# Patient Record
Sex: Female | Born: 1985 | Hispanic: Yes | Marital: Single | Smoking: Former smoker
Health system: Southern US, Community
[De-identification: ages and names within clinical notes are randomized; demographics above are authoritative.]

## PROBLEM LIST (undated history)

## (undated) DIAGNOSIS — O24419 Gestational diabetes mellitus in pregnancy, unspecified control: Secondary | ICD-10-CM

## (undated) HISTORY — PX: HERNIA REPAIR: SHX51

## (undated) HISTORY — DX: Gestational diabetes mellitus in pregnancy, unspecified control: O24.419

---

## 2011-03-04 ENCOUNTER — Emergency Department (HOSPITAL_BASED_OUTPATIENT_CLINIC_OR_DEPARTMENT_OTHER)
Admission: EM | Admit: 2011-03-04 | Discharge: 2011-03-04 | Disposition: A | Discharge status: Home / Self Care | Payer: Self-pay | Attending: Emergency Medicine | Admitting: Emergency Medicine

## 2011-03-04 DIAGNOSIS — L02219 Cutaneous abscess of trunk, unspecified: Secondary | ICD-10-CM | POA: Insufficient documentation

## 2011-03-04 DIAGNOSIS — L02211 Cutaneous abscess of abdominal wall: Secondary | ICD-10-CM

## 2011-03-04 MED ORDER — SULFAMETHOXAZOLE-TRIMETHOPRIM 800-160 MG PO TABS: 1.0000 | ORAL_TABLET | Freq: Two times a day (BID) | ORAL | Status: AC

## 2011-03-04 MED ORDER — HYDROCODONE-ACETAMINOPHEN 5-500 MG PO TABS: 1.0000 | ORAL_TABLET | Freq: Four times a day (QID) | ORAL | Status: AC | PRN

## 2011-03-04 MED ORDER — LIDOCAINE HCL (PF) 1 % IJ SOLN
INTRAMUSCULAR | Status: AC
  Administered 2011-03-04: 5 mL
  Filled 2011-03-04: qty 5

## 2011-03-04 MED ORDER — CEPHALEXIN 500 MG PO CAPS: 500.0000 mg | ORAL_CAPSULE | Freq: Three times a day (TID) | ORAL | Status: AC

## 2011-03-04 NOTE — ED Provider Notes (Signed)
History     CSN: [id number] Arrival date & time: 03/04/2011 11:25 AM   First MD Initiated Contact with Patient 03/04/11 1128      Chief Complaint  Patient presents with  . Abscess    (Consider location/radiation/quality/duration/timing/severity/associated sxs/prior treatment) HPI Comments: Abscess on abdominal wall for one week, getting worse.  Patient is a 25 y.o. female presenting with abscess. The history is provided by the patient.  Abscess  This is a new problem. The current episode started less than one week ago. The onset was gradual. The problem has been gradually worsening. The abscess is present on the abdomen. The problem is moderate. The abscess is characterized by redness and swelling. It is unknown what she was exposed to. The abscess first occurred at home. Pertinent negatives include no fever.    History reviewed. No pertinent past medical history.  History reviewed. No pertinent past surgical history.  No family history on file.  History  Substance Use Topics  . Smoking status: Current Everyday Smoker  . Smokeless tobacco: Not on file  . Alcohol Use: No    OB History    Grav Para Term Preterm Abortions TAB SAB Ect Mult Living                  Review of Systems  Constitutional: Negative for fever and chills.  HENT: Negative for neck pain and neck stiffness.   Gastrointestinal: Negative for abdominal pain and abdominal distention.  Skin: Positive for rash.    Allergies  Review of patient's allergies indicates no known allergies.  Home Medications  No current outpatient prescriptions on file.  BP 122/78  Pulse 89  Temp(Src) 98.3 F (36.8 C) (Oral)  Resp 16  SpO2 98%  LMP 03/01/2011  Physical Exam  Constitutional: She is oriented to person, place, and time. She appears well-developed and well-nourished. No distress.  HENT:  Head: Normocephalic and atraumatic.  Neck: Normal range of motion. Neck supple.  Neurological: She is alert and  oriented to person, place, and time.  Skin:       There is a 3 cm fluctuant area on the abdominal wall in the suprapubic area with surrounding erythema, warmth.      ED Course  Procedures (including critical care time)  Labs Reviewed - No data to display No results found.   No diagnosis found.  INCISION AND DRAINAGE Performed by: Geoffery Lyons Consent: Verbal consent obtained. Risks and benefits: risks, benefits and alternatives were discussed Type: abscess  Body area: abd wall  Anesthesia: local infiltration  Local anesthetic: lidocaine 1% without epinephrine  Anesthetic total: 2 ml  Complexity: complex Blunt dissection to break up loculations  Drainage: purulent  Drainage amount: moderate  Packing material: 1/4 in iodoform gauze  Patient tolerance: Patient tolerated the procedure well with no immediate complications.    MDM  Will treat with keflex, bactrim, lortab.  Recheck in 2 days.        Geoffery Lyons, MD 03/04/11 1149

## 2011-03-04 NOTE — ED Notes (Signed)
abd abscess x 5 days

## 2011-03-08 ENCOUNTER — Encounter (HOSPITAL_BASED_OUTPATIENT_CLINIC_OR_DEPARTMENT_OTHER): Payer: Self-pay | Admitting: *Deleted

## 2011-03-08 ENCOUNTER — Emergency Department (HOSPITAL_BASED_OUTPATIENT_CLINIC_OR_DEPARTMENT_OTHER)
Admission: EM | Admit: 2011-03-08 | Discharge: 2011-03-08 | Disposition: A | Discharge status: Home / Self Care | Payer: Self-pay | Attending: Emergency Medicine | Admitting: Emergency Medicine

## 2011-03-08 DIAGNOSIS — L02211 Cutaneous abscess of abdominal wall: Secondary | ICD-10-CM

## 2011-03-08 DIAGNOSIS — L02219 Cutaneous abscess of trunk, unspecified: Secondary | ICD-10-CM | POA: Insufficient documentation

## 2011-03-08 DIAGNOSIS — F17200 Nicotine dependence, unspecified, uncomplicated: Secondary | ICD-10-CM | POA: Insufficient documentation

## 2011-03-08 NOTE — ED Notes (Signed)
Sterile drsg with bacitracin applied to site

## 2011-03-08 NOTE — ED Provider Notes (Signed)
History     CSN: [id number] Arrival date & time: 03/08/2011  6:30 AM   First MD Initiated Contact with Patient 03/08/11 [phone]      Chief Complaint  Patient presents with  . Abscess    (Consider location/radiation/quality/duration/timing/severity/associated sxs/prior treatment) HPI Comments: Patient here for follow up of abscess I+D.  This was performed by myself 4 days ago.  Patient reports feeling fine.  No problems or issues.    Patient is a 25 y.o. female presenting with abscess. The history is provided by the patient.  Abscess  Pertinent negatives include no fever.    History reviewed. No pertinent past medical history.  History reviewed. No pertinent past surgical history.  No family history on file.  History  Substance Use Topics  . Smoking status: Current Everyday Smoker  . Smokeless tobacco: Not on file  . Alcohol Use: No    OB History    Grav Para Term Preterm Abortions TAB SAB Ect Mult Living                  Review of Systems  Constitutional: Negative for fever and chills.  All other systems reviewed and are negative.    Allergies  Review of patient's allergies indicates no known allergies.  Home Medications   Current Outpatient Rx  Name Route Sig Dispense Refill  . CEPHALEXIN 500 MG PO CAPS Oral Take 1 capsule (500 mg total) by mouth 3 (three) times daily. 21 capsule 0  . HYDROCODONE-ACETAMINOPHEN 5-500 MG PO TABS Oral Take 1-2 tablets by mouth every 6 (six) hours as needed for pain. 15 tablet 0  . SULFAMETHOXAZOLE-TRIMETHOPRIM 800-160 MG PO TABS Oral Take 1 tablet by mouth 2 (two) times daily. 15 tablet 0    BP 115/74  Pulse 81  Resp 20  SpO2 100%  LMP 03/01/2011  Physical Exam  Constitutional: She is oriented to person, place, and time. She appears well-developed and well-nourished.  HENT:  Head: Normocephalic and atraumatic.  Neck: Normal range of motion. Neck supple.  Neurological: She is alert and oriented to person, place, and  time.  Skin: Skin is warm and dry.       The area of abscess appears to be healing well.  There is minimal residual redness and minimal drainage.      ED Course  Procedures (including critical care time)  Labs Reviewed - No data to display No results found.   No diagnosis found.    MDM  The packing was removed and a dressing applied.  Looks great and no further follow up needed.  Patient advised to complete antibiotics, return as needed.        Geoffery Lyons, MD 03/08/11 [phone]

## 2011-03-08 NOTE — ED Notes (Signed)
Patient had an abscess on her abd packed a few days ago. Here to have the packing removed.

## 2011-12-16 ENCOUNTER — Emergency Department (HOSPITAL_COMMUNITY)
Admission: EM | Admit: 2011-12-16 | Discharge: 2011-12-16 | Disposition: A | Discharge status: Home / Self Care | Payer: Self-pay | Attending: Emergency Medicine | Admitting: Emergency Medicine

## 2011-12-16 ENCOUNTER — Encounter (HOSPITAL_COMMUNITY): Payer: Self-pay | Admitting: Emergency Medicine

## 2011-12-16 DIAGNOSIS — T148 Other injury of unspecified body region: Secondary | ICD-10-CM | POA: Insufficient documentation

## 2011-12-16 DIAGNOSIS — W57XXXA Bitten or stung by nonvenomous insect and other nonvenomous arthropods, initial encounter: Secondary | ICD-10-CM | POA: Insufficient documentation

## 2011-12-16 NOTE — ED Notes (Signed)
Pt has reddened area on right forearm that she says she has been picking and squeezing. Thinks it is a spider bite. Did not see it bite her.

## 2011-12-16 NOTE — ED Notes (Signed)
Called pt x1 to bring back to room, no answer

## 2011-12-17 ENCOUNTER — Emergency Department (INDEPENDENT_AMBULATORY_CARE_PROVIDER_SITE_OTHER)
Admission: EM | Admit: 2011-12-17 | Discharge: 2011-12-17 | Disposition: A | Discharge status: Home / Self Care | Payer: Self-pay | Source: Home / Self Care

## 2011-12-17 ENCOUNTER — Encounter (HOSPITAL_COMMUNITY): Payer: Self-pay | Admitting: Emergency Medicine

## 2011-12-17 ENCOUNTER — Telehealth (HOSPITAL_COMMUNITY): Payer: Self-pay | Admitting: *Deleted

## 2011-12-17 DIAGNOSIS — IMO0002 Reserved for concepts with insufficient information to code with codable children: Secondary | ICD-10-CM

## 2011-12-17 DIAGNOSIS — N72 Inflammatory disease of cervix uteri: Secondary | ICD-10-CM

## 2011-12-17 DIAGNOSIS — L03119 Cellulitis of unspecified part of limb: Secondary | ICD-10-CM

## 2011-12-17 LAB — POCT PREGNANCY, URINE: Preg Test, Ur: NEGATIVE

## 2011-12-17 LAB — WET PREP, GENITAL
Trich, Wet Prep: NONE SEEN
Yeast Wet Prep HPF POC: NONE SEEN

## 2011-12-17 MED ORDER — AZITHROMYCIN 1 G PO PACK: 1.0000 g | PACK | Freq: Once | ORAL | Status: DC

## 2011-12-17 MED ORDER — AZITHROMYCIN 250 MG PO TABS: 1000.0000 mg | ORAL_TABLET | Freq: Once | ORAL | Status: DC

## 2011-12-17 MED ORDER — AZITHROMYCIN 250 MG PO TABS
ORAL_TABLET | ORAL | Status: AC
  Filled 2011-12-17: qty 4

## 2011-12-17 MED ORDER — CEFTRIAXONE SODIUM 1 G IJ SOLR
1.0000 g | Freq: Once | INTRAMUSCULAR | Status: AC
  Administered 2011-12-17: 1 g via INTRAMUSCULAR

## 2011-12-17 MED ORDER — AZITHROMYCIN 250 MG PO TABS
1000.0000 mg | ORAL_TABLET | Freq: Every day | ORAL | Status: DC
  Administered 2011-12-17: 1000 mg via ORAL

## 2011-12-17 MED ORDER — METRONIDAZOLE 500 MG PO TABS: 500.0000 mg | ORAL_TABLET | Freq: Two times a day (BID) | ORAL | Status: AC

## 2011-12-17 MED ORDER — DOXYCYCLINE HYCLATE 100 MG PO CAPS: 100.0000 mg | ORAL_CAPSULE | Freq: Two times a day (BID) | ORAL | Status: AC

## 2011-12-17 MED ORDER — LIDOCAINE HCL (PF) 1 % IJ SOLN
INTRAMUSCULAR | Status: AC
  Filled 2011-12-17: qty 5

## 2011-12-17 MED ORDER — CEFTRIAXONE SODIUM 1 G IJ SOLR
INTRAMUSCULAR | Status: AC
  Filled 2011-12-17: qty 10

## 2011-12-17 NOTE — ED Notes (Signed)
Doxycycline rx written by Hayden Rasmussen FNP, called pt to have her pick up.  No answer, left message for pt to return call

## 2011-12-17 NOTE — ED Notes (Signed)
Pt. here to pick up Rx. Doxycycline. She confirmed that she does have the other Rx. of Metronidazole. Kristen [name] 12/17/2011

## 2011-12-17 NOTE — ED Provider Notes (Signed)
History     CSN: [id number]  Arrival date & time 12/17/11  0840   First MD Initiated Contact with Patient 12/17/11 [phone]      Chief Complaint  Patient presents with  . Insect Bite    right fore arm  . Vaginal Discharge    vaginal odor    (Consider location/radiation/quality/duration/timing/severity/associated sxs/prior treatment) HPI Comments: 1: St 5 d ago noticed a small red lesion to the R forearm that progressed in size and underlying hardness. Later developed drainage of amber fluid. In past 2 days has has decreased in size and redness has turned lighter. No bug of any sort was actually seen. Now there is a 3cm slightly mounded pink lesion with narrow area of induration and light, scant amber fluid. Per pt this is much smaller and less tender.      2: Visited her PCP 2 months ago for a UTI and tx with Bactrim. She could not take full course due to GI irritation. She has no urinary sx's now. But c/o vaginal discharge with odor. Denies pelvic or abdominal pain. No missed menses.   History reviewed. No pertinent past medical history.  History reviewed. No pertinent past surgical history.  Family History  Problem Relation Age of Onset  . Diabetes Mother   . Diabetes Father     History  Substance Use Topics  . Smoking status: Current Everyday Smoker  . Smokeless tobacco: Not on file  . Alcohol Use: No    OB History    Grav Para Term Preterm Abortions TAB SAB Ect Mult Living   1 1 1              Review of Systems  Constitutional: Negative.   HENT: Negative.   Gastrointestinal: Negative.   Genitourinary: Positive for vaginal discharge. Negative for dysuria, urgency, decreased urine volume, vaginal bleeding, vaginal pain and pelvic pain.  Musculoskeletal: Negative.   Skin: Negative for pallor and rash.       See HPI   Neurological: Negative.     Allergies  Bactrim  Home Medications   Current Outpatient Rx  Name Route Sig Dispense Refill  . DOXYCYCLINE  HYCLATE 100 MG PO CAPS Oral Take 1 capsule (100 mg total) by mouth 2 (two) times daily. 14 capsule 0  . METRONIDAZOLE 500 MG PO TABS Oral Take 1 tablet (500 mg total) by mouth 2 (two) times daily before a meal. 14 tablet 0    BP 109/60  Pulse 72  Temp 98.6 F (37 C) (Oral)  Resp 16  SpO2 100%  LMP 11/25/2011  Physical Exam  Constitutional: She is oriented to person, place, and time. She appears well-developed and well-nourished.  Neck: Normal range of motion. Neck supple.  Cardiovascular: Normal rate and regular rhythm.   Pulmonary/Chest: No respiratory distress. She has no wheezes.  Abdominal: Soft. She exhibits no distension and no mass. There is no tenderness. There is no rebound and no guarding.  Genitourinary:       BUS normal. Pelvis is adequate for speculum entry. No odor detected. Scant white vaginal discharge. Os parous. No bleeding.  Bimanual: +CMT. No adnexal tenderness.   Musculoskeletal: Normal range of motion.  Neurological: She is alert and oriented to person, place, and time.  Skin: Skin is warm and dry.  Psychiatric: She has a normal mood and affect.    ED Course  Procedures (including critical care time)  Labs Reviewed  WET PREP, GENITAL - Abnormal; Notable for the following:  Clue Cells Wet Prep HPF POC MANY (*)     WBC, Wet Prep HPF POC MODERATE (*)     All other components within normal limits  POCT PREGNANCY, URINE  URINE CULTURE  GC/CHLAMYDIA PROBE AMP, GENITAL  CULTURE, ROUTINE-ABSCESS   No results found.   1. Cellulitis And Abscess Of Forearm   2. Cervicitis       MDM  Rocephin 250mg  IM now.  Azithromycin 1gm  And Flagyl 500 bid for 7 d scripted.  RTO for problems or not improved  Will call results of wet prep and DNA Culture of R arm wound obtained.  Doxy 100 bid for 7 d       Hayden Rasmussen, NP 12/17/11 1506

## 2011-12-17 NOTE — ED Notes (Signed)
Pt c/o ? Spider bite to right forearm red/hard and warm to touch x 5 days. Pt states that she has drained it and cleaned it with rubbing alcohol. Pt also c/o vaginal d/c with odor x several days w/o irritation.  Pt was treated 2 months ago in [place] for a uti did not finish med Bactrim, pt states she experienced dizziness and nausea.

## 2011-12-17 NOTE — ED Provider Notes (Signed)
Medical screening examination/treatment/procedure(s) were performed by non-physician practitioner and as supervising physician I was immediately available for consultation/collaboration.  Leslee Home, M.D.   Reuben Likes, MD 12/17/11 2108

## 2011-12-17 NOTE — ED Notes (Signed)
Pt. called back. I told her Rulon Eisenmenger had called her to pick up a Rx. of Doxycycline for the wound infection from insect bite. I told her I would leave it at the front desk. She said she will pick it up. Kristen [name] 12/17/2011

## 2011-12-18 LAB — GC/CHLAMYDIA PROBE AMP, GENITAL
Chlamydia, DNA Probe: NEGATIVE
GC Probe Amp, Genital: NEGATIVE

## 2011-12-18 LAB — URINE CULTURE
Colony Count: NO GROWTH
Culture: NO GROWTH

## 2011-12-20 LAB — CULTURE, ROUTINE-ABSCESS: Gram Stain: NONE SEEN

## 2011-12-24 NOTE — ED Notes (Addendum)
GC/Chlamydia neg., Wet prep: many clue cells, mod. WBC's, Urine culture: no growth, Abscess culture R arm: Few Staph. Aureus. Pt. adequately treated with Flagyl and Doxycyline. [name] 12/24/2011

## 2014-02-12 ENCOUNTER — Encounter (HOSPITAL_COMMUNITY): Payer: Self-pay | Admitting: Emergency Medicine

## 2015-04-15 ENCOUNTER — Encounter (HOSPITAL_BASED_OUTPATIENT_CLINIC_OR_DEPARTMENT_OTHER): Payer: Self-pay | Admitting: *Deleted

## 2015-04-15 ENCOUNTER — Emergency Department (HOSPITAL_BASED_OUTPATIENT_CLINIC_OR_DEPARTMENT_OTHER): Payer: Self-pay

## 2015-04-15 ENCOUNTER — Emergency Department (HOSPITAL_BASED_OUTPATIENT_CLINIC_OR_DEPARTMENT_OTHER)
Admission: EM | Admit: 2015-04-15 | Discharge: 2015-04-15 | Disposition: A | Discharge status: Home / Self Care | Payer: Self-pay | Attending: Emergency Medicine | Admitting: Emergency Medicine

## 2015-04-15 DIAGNOSIS — J069 Acute upper respiratory infection, unspecified: Secondary | ICD-10-CM | POA: Insufficient documentation

## 2015-04-15 DIAGNOSIS — H109 Unspecified conjunctivitis: Secondary | ICD-10-CM | POA: Insufficient documentation

## 2015-04-15 DIAGNOSIS — F17210 Nicotine dependence, cigarettes, uncomplicated: Secondary | ICD-10-CM | POA: Insufficient documentation

## 2015-04-15 DIAGNOSIS — H938X9 Other specified disorders of ear, unspecified ear: Secondary | ICD-10-CM | POA: Insufficient documentation

## 2015-04-15 DIAGNOSIS — M791 Myalgia: Secondary | ICD-10-CM | POA: Insufficient documentation

## 2015-04-15 MED ORDER — ERYTHROMYCIN 5 MG/GM OP OINT
TOPICAL_OINTMENT | Freq: Once | OPHTHALMIC | Status: AC
  Administered 2015-04-15: 1 via OPHTHALMIC
  Filled 2015-04-15: qty 3.5

## 2015-04-15 MED ORDER — ERYTHROMYCIN 5 MG/GM OP OINT: TOPICAL_OINTMENT | OPHTHALMIC | Status: DC

## 2015-04-15 NOTE — Discharge Instructions (Signed)
Follow with the eye doctor in the next 24 to 28 hours  Take the erythromycin ointment that you were given today and apply a 1 cm ribbon to the lower eye 6 times a day for 7-10 days.   Wash your hands frequently and try to keep your hands away from the affected eye(s).   You should be feeling some improvement by 48 hours. If symptoms worsen, you develop pain, change in your vision or no improvement in 48 hours please follow with the ophthalmologist or, if that is not possible, return to the emergency room for a recheck.  Do not hesitate to return to the emergency room for any new, worsening or concerning symptoms.  Please obtain primary care using resource guide below. Let them know that you were seen in the emergency room and that they will need to obtain records for further outpatient management.    Emergency Department Resource Guide 1) Find a Doctor and Pay Out of Pocket Although you won't have to find out who is covered by your insurance plan, it is a good idea to ask around and get recommendations. You will then need to call the office and see if the doctor you have chosen will accept you as a new patient and what types of options they offer for patients who are self-pay. Some doctors offer discounts or will set up payment plans for their patients who do not have insurance, but you will need to ask so you aren't surprised when you get to your appointment.  2) Contact Your Local Health Department Not all health departments have doctors that can see patients for sick visits, but many do, so it is worth a call to see if yours does. If you don't know where your local health department is, you can check in your phone book. The CDC also has a tool to help you locate your state's health department, and many state websites also have listings of all of their local health departments.  3) Find a Walk-in Clinic If your illness is not likely to be very severe or complicated, you may want to try a  walk in clinic. These are popping up all over the country in pharmacies, drugstores, and shopping centers. They're usually staffed by nurse practitioners or physician assistants that have been trained to treat common illnesses and complaints. They're usually fairly quick and inexpensive. However, if you have serious medical issues or chronic medical problems, these are probably not your best option.  No Primary Care Doctor: - Call Health Connect at  [phone] - they can help you locate a primary care doctor that  accepts your insurance, provides certain services, etc. - Physician Referral Service- [phone]  Chronic Pain Problems: Organization         Address  Phone   Notes  [facility]  [phone] Patients need to be referred by their primary care doctor.   Medication Assistance: Organization         Address  Phone   Notes  [facility]ication [facility] [address] [place], [place] [id number] [phone] --Must be a resident of [facility] [place] -- Must have NO insurance coverage whatsoever (no Medicaid/ Medicare, etc.) -- The pt. MUST have a primary care doctor that directs their care regularly and follows them in the community   Med[facility]ist  [phone]   [employer]  [phone]    Agencies that provide inexpensive medical care: Organization  Address  Phone   Notes  [place]  [phone]   [facility]    [phone]   [facility] [place], [place] [address] [phone]   [place] [address] [place] [place] [phone]   Planned Parenthood    [phone]   [facility]    [phone]   [place] and [place] [address], [place] Phone:  [phone], Fax:  [phone] Hours of Operation:  9 am - 6 pm, M-F.  Also accepts Medicaid/Medicare and self-pay.  [facility] for [place], [address], [place] Phone: [phone], Fax: [phone]. Hours of Operation:  8:30 am - 5:30 pm, M-F.  Also accepts Medicaid and self-pay.  [facility] [place] [address] [place] Phone: [phone]   [place], [place], [place] [phone], Ext. [phone] Mondays & Thursdays: 7-9 AM.  First 15 patients are seen on a first come, first serve basis.    [place] Providers:  Organization         Address  Phone   Notes  [facility] [address], [place] [phone] Also accepts self-pay patients.  [facility] [address], [place]  [phone]   [place], [address], [place] [phone]   [facility] [address], [place] [phone]   Lucianne Lei [address], [place]   [phone] Only accepts [place] Access [place] patients after they have their name applied to their card.   Self-Pay (no insurance) in [facility]:  Organization         Address  Phone   Notes  Sickle Cell Patients, [facility] [place] [phone]   [facility] [place] [phone]   [facility] [place]  [place], [place], [place] [phone]   [facility]/Dr. Osei-Bonsu  [address] [place] or [address], [place] [phone] Phone number for both [place] and [place] locations is the same.  [facility] [address], [place] [phone]   [facility] [address], [place] or [address] [phone]   [facility] [address] [place] [phone], phone; [phone], fax Sees  patients 1st and 3rd Saturday of every month.  Must not qualify for public or private insurance (i.e. Medicaid, Medicare, [place] Health Choice, Veterans' Benefits)  Household income should be no more than 200% of the poverty level The clinic cannot treat you if you are pregnant or think you are pregnant  Sexually transmitted diseases are not treated at the clinic.    Dental Care: Organization         Address  Phone  Notes  [facility] Department of [facility] [place] [phone] Accepts children up to age 36 who are enrolled in [place] or [place]; pregnant women with a Medicaid card; and children who have applied for Medicaid or [place] Health Choice, but were declined, whose parents can pay a reduced fee at time  of service.  [facility] Department of [facility]  [address], [place] [phone] Accepts children up to age 59 who are enrolled in [place] or [place]; pregnant women with a Medicaid card; and children who have applied for Medicaid or [place] Health Choice, but were declined, whose parents can pay a reduced fee at time of service.  [facility] Adult Dental Access PROGRAM  [place] [phone] Patients are seen by appointment only. Walk-ins are not accepted. [place] will see patients 41 years of age and older. Monday - Tuesday (8am-5pm) Most Wednesdays (8:30-5pm) $30 per visit, cash only  [facility] Access [facility]  [address], [facility] [phone] Patients are seen by appointment only. Walk-ins are not accepted. [place] will see patients 59 years of age and older. One Wednesday Evening (Monthly: Volunteer Based).  $30 per visit, cash only  [place]  [phone] for adults; Children under age 48, call Graduate Pediatric Dentistry at [phone]. Children aged 62-14, please call [phone] to request a  pediatric application.  Dental services are provided in all areas of dental care including fillings, crowns and bridges, complete and partial dentures, implants, gum treatment, root canals, and extractions. Preventive care is also provided. Treatment is provided to both adults and children. Patients are selected via a lottery and there is often a waiting list.   [facility] [address] [place]  [phone] [email or web address]   [facility] [address] [place], [place] [phone], Ext. [phone] Second and Fourth Thursday of each month, opens at 6:30 AM; Clinic ends at 9 AM.  Patients are seen on a first-come first-served basis, and a limited number are seen during each clinic.   [facility]  [address] Hillard Danker [place], [place] [phone]   Eligibility Requirements You must have lived in [place], [place], or [place] counties for at least the last three months.   You cannot be eligible for state or federal sponsored [employer], including [employer], [place], or [employer].   You generally cannot be eligible for healthcare insurance through your employer.    How to apply: Eligibility screenings are held every Tuesday and Wednesday afternoon from 1:00 pm until 4:00 pm. You do not need an appointment for the interview!  [facility] [address], [place], [place]   [place]  [place] Department  [place]  [phone]    Behavioral Health Resources in the Community: Intensive Outpatient Programs Organization         Address  Phone  Notes  [place]. [address] [place], [place] [phone]   [facility] Outpatient [address], [place], [place]   ADS: Alcohol & Drug Svcs [address] [place], [place]   [place] [address]  [place], [place] or [phone]   Substance Abuse Resources Organization         Address  Phone  Notes  Alcohol and Drug Services  [phone]   [place]  [phone]   [facility] [place]   Chinita Pester  [phone]   Residential & Outpatient Substance Abuse Program  [phone]   Psychological Services Organization         Address  Phone  Notes  [facility]  [place]  [phone]   [place] [address] [place] or [phone]    Mobile Crisis Teams Organization         Address  Phone  Notes  [facility], Mobile Crisis Care Unit  [phone]   [facility] Psychotherapeutic Services  [address] [place], [place]   [place] Levels [address], [place] [phone]    Self-Help/Support Groups Organization         Address  Phone             Notes  [place]. of [place] - variety of support groups  [place] Call for more information  Narcotics Anonymous (NA), [facility] [address], [employer] [place]  2 meetings at this location   Special educational needs teacher         Address  Phone  Notes  [facility]idential [facility] [place],    [place]  [phone]   [facility]  [address], [place] [phone], [place], [place]   [place], [place] [phone] Admissions: 8am-3pm M-F  Incentives Substance [place] [address],    [place], [place] [phone]   [facility] [address] [place], [place], [place]   [facility] [address],  [place], [place]   [facility] Programs - Intensive Outpatient [address] Kristeen [address], [place], [place]   [facility] ([place] [address].,    [place], [place] [phone] or [phone]   [facility] (RTS) [address], [place], [place] Accepts Medicaid  [facility] [place] [address]  [place] [phone] Substance Abuse/Addiction Treatment   [facility] Organization         Address  Phone  Notes  [facility]  [phone]   Domenic Schwab, PhD [address] Arlis Porta [place], [place]   [phone] or [phone]   [place], [place] [phone]   [facility] [address] [place], [place] [phone] Insurance/Medicaid/sponsorship through [facility] and Families [address] [place]                                    [place], [place] [phone] [place] [address], [place] [phone]    Dr. Adele Schilder  [phone]   [facility] Dept. 1) [address] [place] 2) [place] 3)  [place] [address], [place] [phone]  [phone]   [place] [phone] or [phone] (After Hours)

## 2015-04-15 NOTE — ED Notes (Signed)
Patient transported to X-ray 

## 2015-04-15 NOTE — ED Provider Notes (Signed)
CSN: [id number]     Arrival date & time 04/15/15  1320 History   First MD Initiated Contact with Patient 04/15/15 1402     Chief Complaint  Patient presents with  . URI     (Consider location/radiation/quality/duration/timing/severity/associated sxs/prior Treatment) HPI   Blood pressure 133/81, pulse 104, temperature 97.9 F (36.6 C), temperature source Oral, resp. rate 16, height 5' (1.524 m), weight 49.896 kg, last menstrual period 03/30/2015, SpO2 100 %.  Kristen [name] is a 30 y.o. female complaining of productive cough, sore throat, ear pressure, diffuse headache, myalgia, irritated eyes that are crusted shut in the a.m., tactile fever and chills onset one day ago. Patient denies chest pain, shortness of breath, nausea, vomiting, cervicalgia, focal abdominal pain, change in bowel or bladder habits. On review of systems she notes a slightly decreased by mouth intake.  Denies contact lens use.   History reviewed. No pertinent past medical history. History reviewed. No pertinent past surgical history. Family History  Problem Relation Age of Onset  . Diabetes Mother   . Diabetes Father    Social History  Substance Use Topics  . Smoking status: Current Every Day Smoker -- 0.50 packs/day    Types: Cigarettes  . Smokeless tobacco: None  . Alcohol Use: No   OB History    Gravida Para Term Preterm AB TAB SAB Ectopic Multiple Living   1 1 1             Review of Systems  10 systems reviewed and found to be negative, except as noted in the HPI.   Allergies  Bactrim  Home Medications   Prior to Admission medications   Not on File   BP 133/81 mmHg  Pulse 104  Temp(Src) 97.9 F (36.6 C) (Oral)  Resp 16  Ht 5' (1.524 m)  Wt 49.896 kg  BMI 21.48 kg/m2  SpO2 100%  LMP 03/30/2015 Physical Exam  Constitutional: She appears well-developed and well-nourished.  HENT:  Head: Normocephalic.  Right Ear: External ear normal.  Left Ear: External ear normal.  Mouth/Throat:  Oropharynx is clear and moist. No oropharyngeal exudate.  No drooling or stridor. Posterior pharynx mildly erythematous no significant tonsillar hypertrophy. No exudate. Soft palate rises symmetrically. No TTP or induration under tongue.   No tenderness to palpation of frontal or bilateral maxillary sinuses.  Mild mucosal edema in the nares with scant rhinorrhea.  Bilateral tympanic membranes with normal architecture and good light reflex.    Eyes: EOM are normal. Pupils are equal, round, and reactive to light.  Trace conjunctival injection bilaterally  Neck: Normal range of motion. Neck supple.  Cardiovascular: Normal rate and regular rhythm.   Pulmonary/Chest: Effort normal and breath sounds normal. No stridor. No respiratory distress. She has no wheezes. She has no rales. She exhibits no tenderness.  Abdominal: Soft. There is no tenderness. There is no rebound and no guarding.  Nursing note and vitals reviewed.   ED Course  Procedures (including critical care time) Labs Review Labs Reviewed - No data to display  Imaging Review No results found. I have personally reviewed and evaluated these images and lab results as part of my medical decision-making.   EKG Interpretation None      MDM   Final diagnoses:  URI (upper respiratory infection)  Bilateral conjunctivitis    Filed Vitals:   04/15/15 1333  BP: 133/81  Pulse: 104  Temp: 97.9 F (36.6 C)  TempSrc: Oral  Resp: 16  Height: 5' (1.524 m)  Weight:  49.896 kg  SpO2: 100%    Medications  erythromycin ophthalmic ointment (not administered)    Kristen [name] is 30 y.o. female presenting with cough, sore throat, congestion, headache, subjective fever and chills. Lung sounds are clear to auscultation, vital signs with a very mild tachycardia. She is afebrile, chest x-rays without infiltrate. Patient reports crusting upon awakening, will give erythromycin for conjunctivitis. Counseled on infection control  techniques.  Evaluation does not show pathology that would require ongoing emergent intervention or inpatient treatment. Pt is hemodynamically stable and mentating appropriately. Discussed findings and plan with patient/guardian, who agrees with care plan. All questions answered. Return precautions discussed and outpatient follow up given.   New Prescriptions   ERYTHROMYCIN OPHTHALMIC OINTMENT    Place a 1/2 inch ribbon of ointment into the lower eyelid 6x/day for 7 to 10 days         Wynetta Emeryicole Audray Rumore, PA-C 04/15/15 1510  Rolan BuccoMelanie Belfi, MD 04/15/15 1523

## 2015-04-15 NOTE — ED Notes (Signed)
Patient stable and ambulatory.  Patient verbalizes understanding of discharge medications, instructions and follow-up. 

## 2015-04-15 NOTE — ED Notes (Signed)
Patient is driving home.  So, showed how to administer ointment and patient verbalizes and demonstrated understanding, but didn't administered into patient's eye at this time.  Patient will apply ointment at home in order to not impair vision.

## 2015-04-15 NOTE — ED Notes (Signed)
PA at bedside.

## 2015-04-15 NOTE — ED Notes (Signed)
Cough, sore throat, stuffy ears, headache, aching all over, eyes are itching and draining.

## 2016-02-09 DIAGNOSIS — N200 Calculus of kidney: Secondary | ICD-10-CM | POA: Insufficient documentation

## 2016-10-12 IMAGING — DX DG CHEST 2V
2 series · 2 of 2 positions shown · non-contrast
Comparison: None.

CLINICAL DATA: Cough, fever, and congestion for 1 week.

EXAM:
CHEST  2 VIEW

[chest pa]
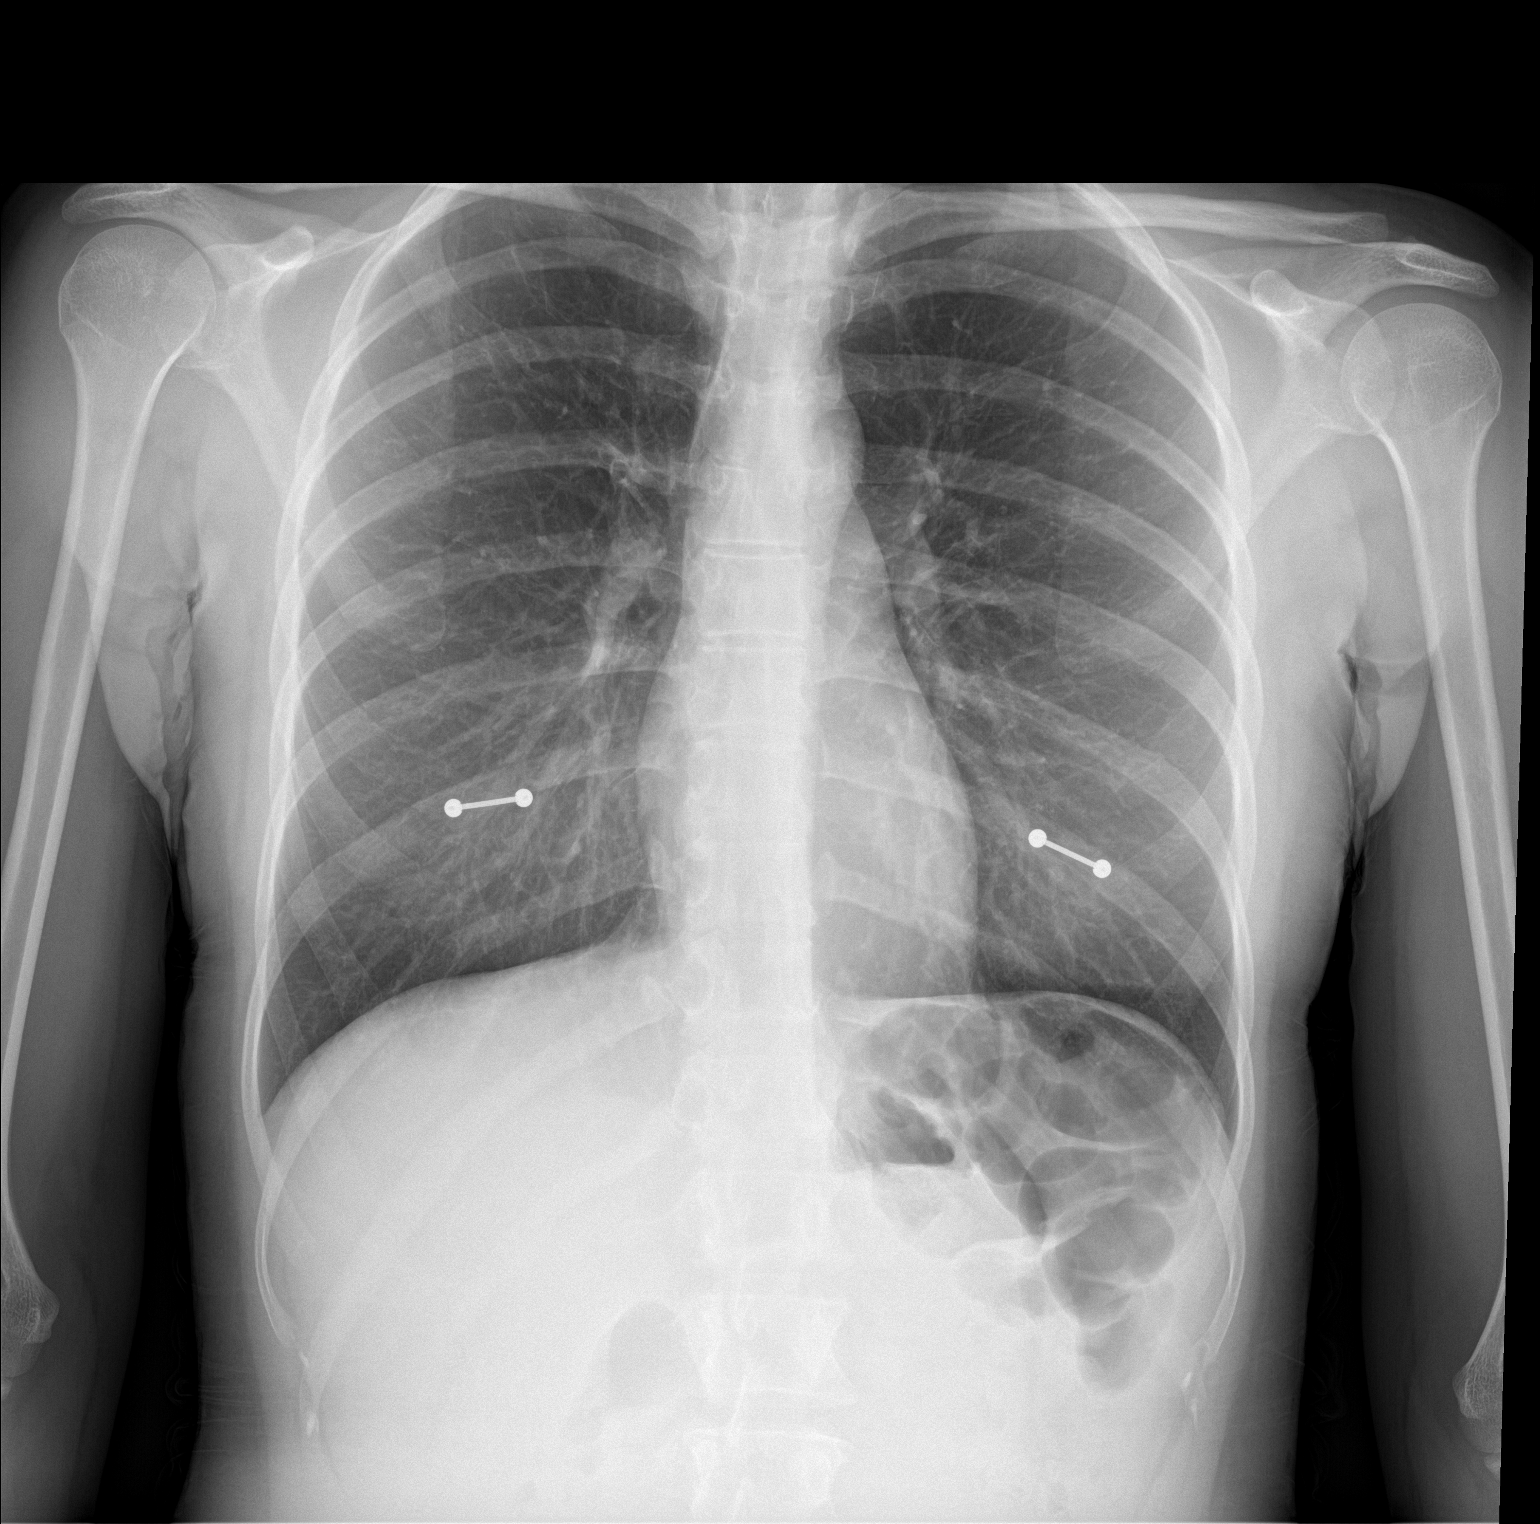

[chest lat]
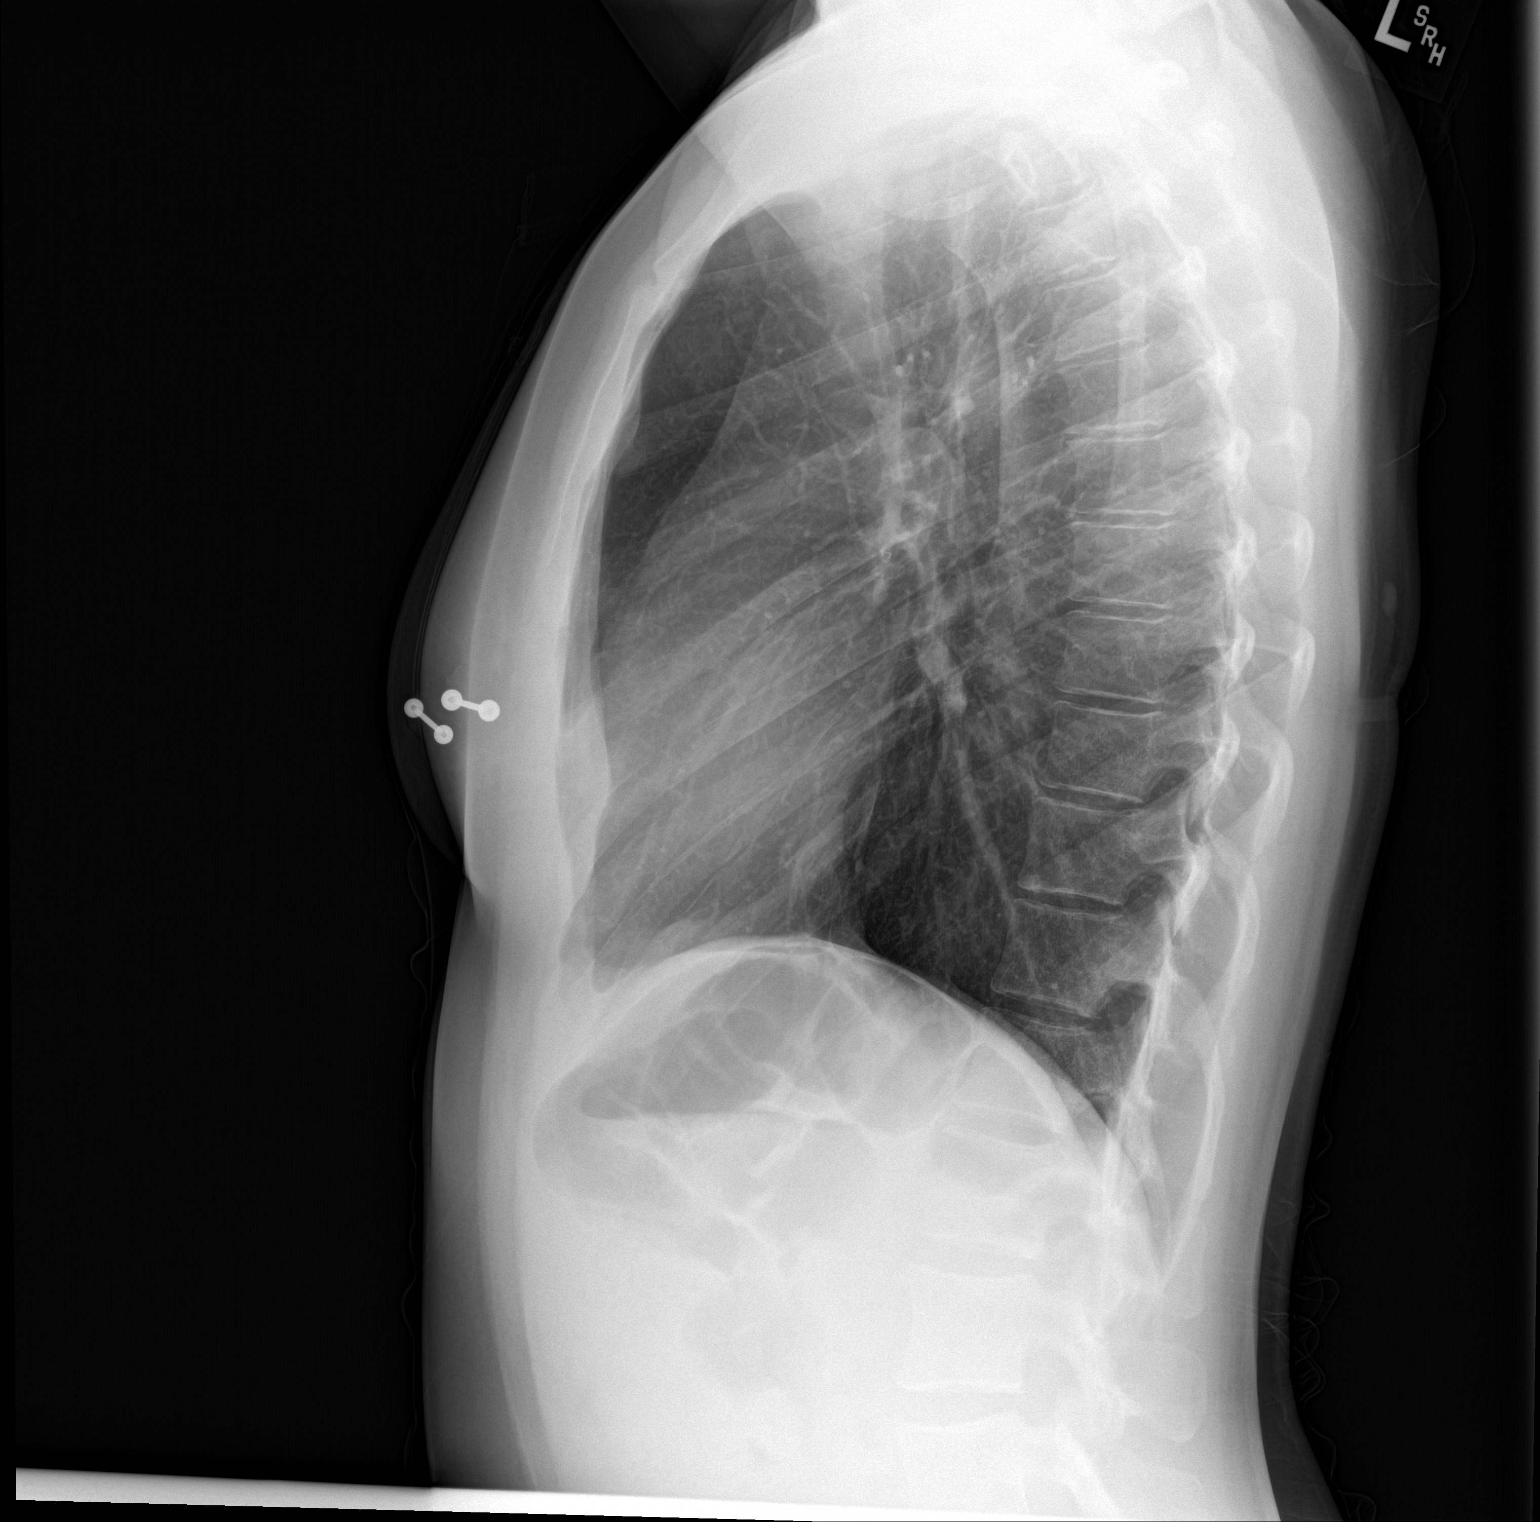

[2 of 2 positions shown; findings below may reference images not displayed]

FINDINGS: The heart size and mediastinal contours are within normal limits.
Both lungs are clear. There may be mild hyperinflation. The
visualized skeletal structures are unremarkable. Overlying nipple
rings.
IMPRESSION: No active cardiopulmonary disease.

## 2017-05-17 DIAGNOSIS — K429 Umbilical hernia without obstruction or gangrene: Secondary | ICD-10-CM | POA: Insufficient documentation

## 2017-05-17 HISTORY — DX: Umbilical hernia without obstruction or gangrene: K42.9

## 2017-05-19 HISTORY — PX: HERNIA REPAIR: SHX51

## 2019-05-02 DIAGNOSIS — O09299 Supervision of pregnancy with other poor reproductive or obstetric history, unspecified trimester: Secondary | ICD-10-CM | POA: Insufficient documentation

## 2021-12-04 ENCOUNTER — Encounter: Payer: Self-pay | Admitting: Family Medicine

## 2021-12-04 ENCOUNTER — Other Ambulatory Visit (HOSPITAL_COMMUNITY)
Admission: RE | Admit: 2021-12-04 | Discharge: 2021-12-04 | Disposition: A | Discharge status: Home / Self Care | Payer: Medicaid Other | Source: Ambulatory Visit | Attending: Family Medicine | Admitting: Family Medicine

## 2021-12-04 ENCOUNTER — Ambulatory Visit (INDEPENDENT_AMBULATORY_CARE_PROVIDER_SITE_OTHER): Payer: Medicaid Other | Admitting: Family Medicine

## 2021-12-04 VITALS — BP 142/80 | HR 70 | Ht 61.0 in | Wt 131.0 lb

## 2021-12-04 DIAGNOSIS — Z3481 Encounter for supervision of other normal pregnancy, first trimester: Secondary | ICD-10-CM

## 2021-12-04 DIAGNOSIS — O0990 Supervision of high risk pregnancy, unspecified, unspecified trimester: Secondary | ICD-10-CM | POA: Insufficient documentation

## 2021-12-04 DIAGNOSIS — O09511 Supervision of elderly primigravida, first trimester: Secondary | ICD-10-CM

## 2021-12-04 DIAGNOSIS — Z8632 Personal history of gestational diabetes: Secondary | ICD-10-CM

## 2021-12-04 DIAGNOSIS — O09529 Supervision of elderly multigravida, unspecified trimester: Secondary | ICD-10-CM | POA: Insufficient documentation

## 2021-12-04 DIAGNOSIS — Z3A10 10 weeks gestation of pregnancy: Secondary | ICD-10-CM

## 2021-12-04 NOTE — Progress Notes (Signed)
Patient desires waterbirth- information given with new ob packet. Armandina Stammer RN

## 2021-12-04 NOTE — Addendum Note (Signed)
Addended by: Levie Heritage on: 12/04/2021 12:08 PM   Modules accepted: Orders

## 2021-12-04 NOTE — Patient Instructions (Signed)

## 2021-12-04 NOTE — Progress Notes (Signed)
Subjective:  Kristen [name] is a [id number] [redacted]w[redacted]d being seen today for her first obstetrical visit.  Her obstetrical history is significant for advanced maternal age, h/o A1 GDM. Has 5 prior vaginal deliveries that have been uncomplicated. Patient does intend to breast feed. Pregnancy history fully reviewed.  Patient reports no complaints.  BP (!) 142/80   Pulse 70   Ht 5\' 1"  (1.549 m)   Wt 131 lb (59.4 kg)   LMP 09/24/2021   BMI 24.75 kg/m   HISTORY: OB History  Gravida Para Term Preterm AB Living  6 5 5     5   SAB IAB Ectopic Multiple Live Births          5    # Outcome Date GA Lbr Len/2nd Weight Sex Delivery Anes PTL Lv  6 Current           5 Term 10/29/19   8 lb 5 oz (3.771 kg) F Vag-Spont     4 Term 06/22/16   8 lb 5 oz (3.771 kg) F Vag-Spont   LIV  3 Term 06/16/13   7 lb 11 oz (3.487 kg) F Vag-Spont     2 Term 06/25/10   8 lb 9 oz (3.884 kg) F Vag-Spont     1 Term 05/09/04   7 lb 10 oz (3.459 kg) M Vag-Spont       No past medical history on file.  No past surgical history on file.  Family History  Problem Relation Age of Onset   Diabetes Mother    Diabetes Father      Exam  BP (!) 142/80   Pulse 70   Ht 5\' 1"  (1.549 m)   Wt 131 lb (59.4 kg)   LMP 09/24/2021   BMI 24.75 kg/m   Chaperone present during exam  CONSTITUTIONAL: Well-developed, well-nourished female in no acute distress.  HENT:  Normocephalic, atraumatic, External right and left ear normal. Oropharynx is clear and moist EYES: Conjunctivae and EOM are normal. Pupils are equal, round, and reactive to light. No scleral icterus.  NECK: Normal range of motion, supple, no masses.  Normal thyroid.  CARDIOVASCULAR: Normal heart rate noted, regular rhythm RESPIRATORY: Clear to auscultation bilaterally. Effort and breath sounds normal, no problems with respiration noted. BREASTS: Symmetric in size. No masses, skin changes, nipple drainage, or lymphadenopathy. ABDOMEN: Soft, normal bowel sounds, no  distention noted.  No tenderness, rebound or guarding.  PELVIC: Normal appearing external genitalia; normal appearing vaginal mucosa and cervix. No abnormal discharge noted. Normal uterine size, no other palpable masses, no uterine or adnexal tenderness. MUSCULOSKELETAL: Normal range of motion. No tenderness.  No cyanosis, clubbing, or edema.  2+ distal pulses. SKIN: Skin is warm and dry. No rash noted. Not diaphoretic. No erythema. No pallor. NEUROLOGIC: Alert and oriented to person, place, and time. Normal reflexes, muscle tone coordination. No cranial nerve deficit noted. PSYCHIATRIC: Normal mood and affect. Normal behavior. Normal judgment and thought content.    Assessment:    Pregnancy: 05/11/04 There are no problems to display for this patient.     Plan:   1. Supervision of high risk pregnancy, antepartum FHT and FH normal - CBC/D/Plt+RPR+Rh+ABO+RubIgG... - Cytology - PAP( [place]) - Hemoglobin A1c - Enroll Patient in PreNatal Babyscripts - MFM OB DETAIL +14 WK; Future  2. Antepartum multigravida of advanced maternal age Recommended ASA 81mg  at 12 weeks  - CBC/D/Plt+RPR+Rh+ABO+RubIgG... - Cytology - PAP( [place]) - Hemoglobin A1c - Enroll Patient in PreNatal Babyscripts -  [place] MFM OB DETAIL +14 WK; Future  3. [redacted] weeks gestation of pregnancy  4. History of gestational diabetes Always diet controlled. Normal BMI (24) Expect 20-25# weight gain. - Hemoglobin A1c    Initial labs obtained Continue prenatal vitamins Reviewed n/v relief measures and warning s/s to report Reviewed recommended weight gain based on pre-gravid BMI Encouraged well-balanced diet Genetic & carrier screening discussed: requests [place] and [place] ,  Ultrasound discussed; fetal survey: requested CCNC completed> form faxed if has or is planning to apply for medicaid The nature of [place] [facility] [employer] with multiple MDs and other Advanced Practice Providers was  explained to patient; also emphasized that fellows, residents, and students are part of our team.   Problem list reviewed and updated. 75% of 30 min visit spent on counseling and coordination of care.     Levie Heritage 12/04/2021

## 2021-12-05 ENCOUNTER — Encounter: Payer: Self-pay | Admitting: Family Medicine

## 2021-12-05 LAB — CBC/D/PLT+RPR+RH+ABO+RUBIGG...
Antibody Screen: NEGATIVE
Basophils Absolute: 0 10*3/uL (ref 0.0–0.2)
Basos: 0 %
EOS (ABSOLUTE): 0.1 10*3/uL (ref 0.0–0.4)
Eos: 2 %
HCV Ab: NONREACTIVE
HIV Screen 4th Generation wRfx: NONREACTIVE
Hematocrit: 37.7 % (ref 34.0–46.6)
Hemoglobin: 12.7 g/dL (ref 11.1–15.9)
Hepatitis B Surface Ag: NEGATIVE
Immature Grans (Abs): 0 10*3/uL (ref 0.0–0.1)
Immature Granulocytes: 0 %
Lymphocytes Absolute: 1.7 10*3/uL (ref 0.7–3.1)
Lymphs: 24 %
MCH: 29.7 pg (ref 26.6–33.0)
MCHC: 33.7 g/dL (ref 31.5–35.7)
MCV: 88 fL (ref 79–97)
Monocytes Absolute: 0.4 10*3/uL (ref 0.1–0.9)
Monocytes: 6 %
Neutrophils Absolute: 5.1 10*3/uL (ref 1.4–7.0)
Neutrophils: 68 %
Platelets: 340 10*3/uL (ref 150–450)
RBC: 4.28 x10E6/uL (ref 3.77–5.28)
RDW: 13.2 % (ref 11.7–15.4)
RPR Ser Ql: NONREACTIVE
Rh Factor: POSITIVE
Rubella Antibodies, IGG: 2.63 index (ref >0.99)
WBC: 7.4 10*3/uL (ref 3.4–10.8)

## 2021-12-05 LAB — HCV INTERPRETATION

## 2021-12-05 LAB — HEMOGLOBIN A1C
Est. average glucose Bld gHb Est-mCnc: 120 mg/dL
Hgb A1c MFr Bld: 5.8 % — ABNORMAL HIGH (ref 4.8–5.6)

## 2021-12-09 LAB — CYTOLOGY - PAP
Chlamydia: NEGATIVE
Comment: NEGATIVE
Comment: NEGATIVE
Comment: NORMAL
Diagnosis: NEGATIVE
High risk HPV: NEGATIVE
Neisseria Gonorrhea: NEGATIVE

## 2021-12-10 LAB — PANORAMA PRENATAL TEST FULL PANEL:PANORAMA TEST PLUS 5 ADDITIONAL MICRODELETIONS: FETAL FRACTION: 5.7

## 2021-12-12 LAB — HORIZON 4 (SMA, CF, FRAGILE X, DMD)
CYSTIC FIBROSIS: NEGATIVE
DUCHENNE/BECKER MUSCULAR DYSTROPHY: NEGATIVE
FRAGILE X SYNDROME: NEGATIVE
REPORT SUMMARY: NEGATIVE
SPINAL MUSCULAR ATROPHY: NEGATIVE

## 2022-01-13 ENCOUNTER — Ambulatory Visit (INDEPENDENT_AMBULATORY_CARE_PROVIDER_SITE_OTHER): Payer: Medicaid Other | Admitting: Advanced Practice Midwife

## 2022-01-13 VITALS — BP 112/74 | HR 88 | Wt 136.0 lb

## 2022-01-13 DIAGNOSIS — Z8632 Personal history of gestational diabetes: Secondary | ICD-10-CM

## 2022-01-13 DIAGNOSIS — Q631 Lobulated, fused and horseshoe kidney: Secondary | ICD-10-CM | POA: Insufficient documentation

## 2022-01-13 DIAGNOSIS — O0992 Supervision of high risk pregnancy, unspecified, second trimester: Secondary | ICD-10-CM

## 2022-01-13 DIAGNOSIS — Z3A15 15 weeks gestation of pregnancy: Secondary | ICD-10-CM

## 2022-01-13 HISTORY — DX: Lobulated, fused and horseshoe kidney: Q63.1

## 2022-01-13 NOTE — Progress Notes (Signed)
   PRENATAL VISIT NOTE  Subjective:  Kristen [name] is a 36 y.o. G6P5005 at [redacted]w[redacted]d being seen today for ongoing prenatal care.  She is currently monitored for the following issues for this low-risk pregnancy and has Supervision of high risk pregnancy, antepartum; History of gestational diabetes in prior pregnancy, currently pregnant; Antepartum multigravida of advanced maternal age; Horseshoe kidney; Kidney stones; and Umbilical hernia without obstruction and without gangrene on their problem list.  Patient reports no complaints.  Contractions: Not present. Vag. Bleeding: None.   . Denies leaking of fluid.   The following portions of the patient's history were reviewed and updated as appropriate: allergies, current medications, past family history, past medical history, past social history, past surgical history and problem list.   Objective:   Vitals:   01/13/22 0851  BP: 112/74  Pulse: 88  Weight: 136 lb (61.7 kg)    Fetal Status: Fetal Heart Rate (bpm): 150         General:  Alert, oriented and cooperative. Patient is in no acute distress.  Skin: Skin is warm and dry. No rash noted.   Cardiovascular: Normal heart rate noted  Respiratory: Normal respiratory effort, no problems with respiration noted  Abdomen: Soft, gravid, appropriate for gestational age.  Pain/Pressure: Absent     Pelvic: Cervical exam deferred        Extremities: Normal range of motion.  Edema: None  Mental Status: Normal mood and affect. Normal behavior. Normal judgment and thought content.   Assessment and Plan:  Pregnancy: G3O7564 at [redacted]w[redacted]d 1. History of gestational diabetes    Hgb A1C was 5.8    Per Dr Nehemiah Settle will do an early 2 hour GTT    Discussed possibility of starting some random testing at home now.      Discussed T2DM vs gestational   2. [redacted] weeks gestation of pregnancy     Reviewed plans for [place] and further OB appts     May want WaterBirth.  Discussed class and possible exclusion criteria as preg  progresses.      Largest baby was 8+ 23 with no shoulder dystocia.Did have poly last pregnancy but no problems with delivery  Preterm labor symptoms and general obstetric precautions including but not limited to vaginal bleeding, contractions, leaking of fluid and fetal movement were reviewed in detail with the patient. Please refer to After Visit Summary for other counseling recommendations.     Future Appointments  Date Time Provider [place]  01/14/2022  8:50 AM [facility] NURSE [facility] None  02/16/2022  2:30 PM [facility] NURSE [facility]  02/16/2022  2:45 PM [facility] US4 [facility]  02/17/2022 10:55 AM Jimmye Norman, Wilkie Aye, CNM [facility] None    Hansel Feinstein, CNM

## 2022-01-13 NOTE — Patient Instructions (Signed)
Considering Waterbirth? Guide for patients at [facility] [employer] [facility]) Why consider waterbirth? Gentle birth for babies  Less pain medicine used in labor  May allow for passive descent/less pushing  May reduce perineal tears  More mobility and instinctive maternal position changes  Increased maternal relaxation   Is waterbirth safe? What are the risks of infection, drowning or other complications? Infection:  Very low risk (3.7 % for tub vs 4.8% for bed)  7 in 8000 waterbirths with documented infection  Poorly cleaned equipment most common cause  Slightly lower group B strep transmission rate  Drowning  Maternal:  Very low risk  Related to seizures or fainting  Newborn:  Very low risk. No evidence of increased risk of respiratory problems in multiple large studies  Physiological protection from breathing under water  Avoid underwater birth if there are any fetal complications  Once baby's head is out of the water, keep it out.  Birth complication  Some reports of cord trauma, but risk decreased by bringing baby to surface gradually  No evidence of increased risk of shoulder dystocia. Mothers can usually change positions faster in water than in a bed, possibly aiding the maneuvers to free the shoulder.   There are 2 things you MUST do to have a waterbirth with [facility]: Attend a waterbirth class at [place] at [facility]   3rd Wednesday of every month from 7-9 pm (virtual during [place]) [employer] at [email or web address] or [email or web address] or by calling [phone] Bring [place] the certificate from the class to your prenatal appointment or send via [place] with a midwife at 36 weeks* to see if you can still plan a waterbirth and to sign the consent.   *We also recommend that you schedule as many of your prenatal visits with a midwife as possible.    Helpful information: You may want to bring a bathing suit top to the hospital to  wear during labor but this is optional.  All other supplies are provided by the hospital. Please arrive at the hospital with signs of active labor, and do not wait at home until late in labor. It takes 45 min- 2 hours for COVID testing, fetal monitoring, and check in to your room to take place, plus transport and filling of the waterbirth tub.    Things that would prevent you from having a waterbirth: Unknown or Positive COVID-19 diagnosis upon admission to hospital* Premature, <37wks  Previous cesarean birth  Presence of thick meconium-stained fluid  Multiple gestation (Twins, triplets, etc.)  Uncontrolled diabetes or gestational diabetes requiring medication  Hypertension diagnosed in pregnancy or preexisting hypertension (gestational hypertension, preeclampsia, or chronic hypertension) Fetal growth restriction (your baby measures less than 10th percentile on ultrasound) Heavy vaginal bleeding  Non-reassuring fetal heart rate  Active infection (MRSA, etc.). Group B Strep is NOT a contraindication for waterbirth.  If your labor has to be induced and induction method requires continuous monitoring of the baby's heart rate  Other risks/issues identified by your obstetrical provider   Please remember that birth is unpredictable. Under certain unforeseeable circumstances your provider may advise against giving birth in the tub. These decisions will be made on a case-by-case basis and with the safety of you and your baby as our highest priority.   *Please remember that in order to have a waterbirth, you must test Negative to COVID-19 upon admission to the hospital.

## 2022-01-14 ENCOUNTER — Encounter: Payer: Self-pay | Admitting: General Practice

## 2022-01-14 ENCOUNTER — Ambulatory Visit: Payer: Medicaid Other

## 2022-01-14 DIAGNOSIS — O0990 Supervision of high risk pregnancy, unspecified, unspecified trimester: Secondary | ICD-10-CM

## 2022-01-14 DIAGNOSIS — Z3A16 16 weeks gestation of pregnancy: Secondary | ICD-10-CM

## 2022-01-14 DIAGNOSIS — O09299 Supervision of pregnancy with other poor reproductive or obstetric history, unspecified trimester: Secondary | ICD-10-CM

## 2022-01-14 NOTE — Progress Notes (Signed)
Patient sent to lab. Bexleigh Theriault HowardRN   

## 2022-01-16 ENCOUNTER — Encounter: Payer: Self-pay | Admitting: Advanced Practice Midwife

## 2022-01-16 DIAGNOSIS — E119 Type 2 diabetes mellitus without complications: Secondary | ICD-10-CM | POA: Insufficient documentation

## 2022-01-16 DIAGNOSIS — O24419 Gestational diabetes mellitus in pregnancy, unspecified control: Secondary | ICD-10-CM | POA: Insufficient documentation

## 2022-01-16 LAB — GLUCOSE TOLERANCE, 2 HOURS W/ 1HR
Glucose, 1 hour: 195 mg/dL — ABNORMAL HIGH (ref 70–179)
Glucose, 2 hour: 114 mg/dL (ref 70–152)
Glucose, Fasting: 85 mg/dL (ref 70–91)

## 2022-01-16 LAB — AFP, SERUM, OPEN SPINA BIFIDA
AFP MoM: 0.5
AFP Value: 18.2 ng/mL
Gest. Age on Collection Date: 16 weeks
Maternal Age At EDD: 35.8 yr
OSBR Risk 1 IN: 10000
Test Results:: NEGATIVE
Weight: 136 [lb_av]

## 2022-01-19 ENCOUNTER — Other Ambulatory Visit: Payer: Self-pay

## 2022-01-19 DIAGNOSIS — O24419 Gestational diabetes mellitus in pregnancy, unspecified control: Secondary | ICD-10-CM

## 2022-01-19 DIAGNOSIS — O99810 Abnormal glucose complicating pregnancy: Secondary | ICD-10-CM

## 2022-01-19 NOTE — Progress Notes (Signed)
Referral to diabetes management- failed gtt. Kathrene Alu RN

## 2022-01-20 ENCOUNTER — Encounter: Payer: Self-pay | Admitting: Advanced Practice Midwife

## 2022-02-12 ENCOUNTER — Other Ambulatory Visit: Payer: Medicaid Other

## 2022-02-16 ENCOUNTER — Ambulatory Visit: Payer: Medicaid Other | Attending: Family Medicine

## 2022-02-16 ENCOUNTER — Encounter: Payer: Self-pay | Admitting: Family Medicine

## 2022-02-16 ENCOUNTER — Ambulatory Visit: Payer: Medicaid Other | Admitting: *Deleted

## 2022-02-16 ENCOUNTER — Ambulatory Visit: Payer: Medicaid Other | Attending: Obstetrics and Gynecology | Admitting: Obstetrics and Gynecology

## 2022-02-16 ENCOUNTER — Other Ambulatory Visit: Payer: Self-pay | Admitting: *Deleted

## 2022-02-16 DIAGNOSIS — O24410 Gestational diabetes mellitus in pregnancy, diet controlled: Secondary | ICD-10-CM

## 2022-02-16 DIAGNOSIS — O09529 Supervision of elderly multigravida, unspecified trimester: Secondary | ICD-10-CM | POA: Diagnosis present

## 2022-02-16 DIAGNOSIS — O0990 Supervision of high risk pregnancy, unspecified, unspecified trimester: Secondary | ICD-10-CM | POA: Insufficient documentation

## 2022-02-16 DIAGNOSIS — O0942 Supervision of pregnancy with grand multiparity, second trimester: Secondary | ICD-10-CM | POA: Diagnosis not present

## 2022-02-16 DIAGNOSIS — Z3A20 20 weeks gestation of pregnancy: Secondary | ICD-10-CM | POA: Diagnosis not present

## 2022-02-16 DIAGNOSIS — O09522 Supervision of elderly multigravida, second trimester: Secondary | ICD-10-CM | POA: Diagnosis not present

## 2022-02-16 DIAGNOSIS — O24419 Gestational diabetes mellitus in pregnancy, unspecified control: Secondary | ICD-10-CM

## 2022-02-16 NOTE — Progress Notes (Signed)
Maternal-Fetal Medicine   Name: Kristen [name] DOB: 10/17/1985 MRN: [id number] Referring Provider: Hansel Feinstein, CNM  I had the pleasure of seeing [name] today at the [place] [facility].  She was accompanied by her husband.  She is G6 P5005 at 20w 5d gestation and is here for fetal anatomy scan.  She has a new diagnosis of gestational diabetes. She has not yet started checking her blood glucose. Recent hemoglobin A1c was 5.8%.   Advanced maternal age.  On cell-free fetal DNA screening, the risks of aneuploidies are not increased. MSAFP screening showed low risk for open-neural tube defects.  Patient reports no chronic medical conditions. Obstetrical history is significant for 5 term vaginal deliveries.  Her last 3 pregnancies were complicated with gestational diabetes (diet-controlled).  Ultrasound We performed a fetal anatomy scan. No markers of aneuploidies or fetal structural defects are seen. Fetal biometry is consistent with her previously-established dates. Amniotic fluid is normal and good fetal activity is seen. Patient understands the limitations of ultrasound in detecting fetal anomalies.   Gestational diabetes I explained the diagnosis of gestational diabetes.  Recent hemoglobin A1c of 5.8% is not consistent with type 2 diabetes.  I emphasized the importance of good blood glucose control to prevent adverse fetal or neonatal outcomes.  I discussed normal range of blood glucose values. I encouraged her to meet with our diabetic educator and start checking her blood glucose.  Possible complications of gestational diabetes include fetal macrosomia, shoulder dystocia and birth injuries, stillbirth (in poorly controlled diabetes) and neonatal respiratory syndrome and other complications.  In about 85% of cases, gestational diabetes is well controlled by diet alone.  Exercise reduces the need for insulin.  Medical treatment includes oral hypoglycemics or insulin.  Timing of  delivery: In well-controlled diabetes on diet, patient can be delivered at 36- or 40-weeks' gestation. Vaginal delivery is not contraindicated. Type 2 diabetes develops in up to 50% of women with GDM. I recommend postpartum screening with 75-g glucose load at 6 to 12 weeks after delivery. [place] multiparity Postpartum hemorrhage is more common in grand multiparous women.  Her recent hemoglobin is 12.7 g/dL.  Anemia if develops in pregnancy should be treated with iron supplements.  Recommendations -An appointment was made for her to return in 4 weeks for fetal growth assessment. -Fetal growth assessments every 4 weeks till delivery. -Weekly BPP from [redacted] weeks gestation if patient requires metformin and/or insulin.  Thank you for consultation.  If you have any questions or concerns, please contact me the [facility].  Consultation including face-to-face (more than 50%) counseling 30 minutes.

## 2022-02-17 ENCOUNTER — Ambulatory Visit (INDEPENDENT_AMBULATORY_CARE_PROVIDER_SITE_OTHER): Payer: Medicaid Other | Admitting: Medical

## 2022-02-17 ENCOUNTER — Encounter: Payer: Self-pay | Admitting: Medical

## 2022-02-17 VITALS — BP 110/69 | HR 72 | Wt 141.0 lb

## 2022-02-17 DIAGNOSIS — O09529 Supervision of elderly multigravida, unspecified trimester: Secondary | ICD-10-CM

## 2022-02-17 DIAGNOSIS — O0990 Supervision of high risk pregnancy, unspecified, unspecified trimester: Secondary | ICD-10-CM

## 2022-02-17 DIAGNOSIS — O24419 Gestational diabetes mellitus in pregnancy, unspecified control: Secondary | ICD-10-CM

## 2022-02-17 DIAGNOSIS — O0992 Supervision of high risk pregnancy, unspecified, second trimester: Secondary | ICD-10-CM

## 2022-02-17 DIAGNOSIS — O09522 Supervision of elderly multigravida, second trimester: Secondary | ICD-10-CM

## 2022-02-17 DIAGNOSIS — Z3A20 20 weeks gestation of pregnancy: Secondary | ICD-10-CM

## 2022-02-17 NOTE — Patient Instructions (Signed)
AREA PEDIATRIC/FAMILY PRACTICE PHYSICIANS  [place]outheast [place] ([address]) [facility] Chambliss, MD; Eniola, MD; Hale, MD; Hensel, MD; McDiarmid, MD; McIntyer, MD; Neal, MD; Walden, MD [address] [place], [place] [address] [phone] Mon-Fri 8:30-12:30, 1:30-5:00 Providers come to see babies at [facility] Accepting Medicaid [facility] providers who accept newborns: Koirala, MD; Morrow, MD; Wolters, MD [address], [place], [place] [address] [phone] Mon-Fri 8:00-5:30 Babies seen by providers at [facility] Does NOT accept Medicaid Please call early in hospitalization for appointment (limited availability)  [facility] [place], MD [address] [place], [place] [address] [phone] Mon, Tue, Thur, Fri 8:30-5:00, Wed 10:00-7:00 (closed 1-2pm) Babies seen by [facility] providers Accepting Medicaid Rubin - Pediatrician Rubin, MD [address], [place], [place] [address] [phone] Mon-Fri 8:30-5:00, Sat 8:30-12:00 Provider comes to see babies at [facility] Accepting Medicaid Must have been referred from current patients or contacted office prior to delivery [facility] ([facility]) Brown, MD; Chandler, MD; Ettefagh, MD; Grant, MD; Lester, MD; McCormick, MD; McQueen, MD; Prose, MD; Simha, MD; Stanley, MD; Stryffeler, NP; Tebben, NP [address], [place], [place] [address] [phone] Mon, Tue, Thur, Fri 8:30-5:30, Wed 9:30-5:30, Sat 8:30-12:30 Babies seen by [facility] providers Accepting Medicaid Only accepting infants of first-time parents or siblings of current patients Hospital discharge coordinator will make follow-up appointment Jack Amos [address], [place], [place]  [address] [phone]   Fax - [phone] [facility] [address], [place], [place]  [address] Phone - [phone]   Fax - [phone] Shilpa Gosrani [address], [place], [place]  [address] [phone]  [place] ([address]) [facility] Bates, MD; Brassfield, MD; Cooper, Cox, MD; MD; Davis, MD; Dovico, MD; Ettefaugh, MD; Little, MD; Lowe, MD; Keiffer, MD; Melvin, MD; Sumner, MD; Williams, MD [address], [place], [place] [address] [phone] Mon-Fri 8:30-5:00 (extended evenings Mon-Thur as needed), Sat-Sun 10:00-1:00 Providers come to see babies at [facility] Accepting Medicaid for families of first-time babies and families with all children in the household age 3 and under. Must register with office prior to making appointment (M-F only). [facility] Henson, NP; Knapp, MD; Lalonde, MD; Tysinger, PA [address] [place], [place] [address] [phone] Mon-Fri 8:00-5:00 Babies seen by providers at [facility] Does NOT accept Medicaid/Commercial Insurance Only [facility] ([facility])  Artis, MD; Barnes, MD; Bratton, MD; Coccaro, MD; Lockett Gardner, MD; Kramer, MD; Marshall, MD; Netherton, MD; Poleto, MD; Skinner, MD [address] [place], [place] [address] [phone] Mon-Fri 8:30-5:30, Sat (Oct.-Mar.) 9:00-1:00 Babies seen by providers at [facility] Accepting Medicaid  [place] ([address]) [facility] Reid, MD; Warner, MD [address], [place], [place] [address] [phone] Mon-Fri 8:30-5:00, Sat 8:30-12:00 Providers come to see babies at [facility] Does NOT accept Medicaid [facility] Becker, PA; Hagler, MD; Scifres, PA; Sun, MD; Swayne, MD [address], [place],  [address] [phone] Mon-Fri 8:00-5:00 Babies seen by providers at [facility] Does NOT accept Medicaid Only accepting babies of parents who  are patients Please call early in hospitalization for appointment (limited availability) [facility] Clark, MD; Frye, MD; Kelleher, MD; Mack, NP; Miller, MD; O'Keller, MD; Patterson, NP; Pudlo, MD; Puzio, MD; Thomas, MD; Tucker, MD; Twiselton, MD [address], [place], [place] [address] [phone] Mon-Fri 8:00-5:00, Sat 9:00-12:00 Providers come to see babies at [facility] Does NOT accept Medicaid  [place] ([address]) [facility] providers accepting new patients: Brake, NP; Wharton, PA [address], [place], [place] [address] [phone] Mon-Fri 8:00-5:00 Babies seen by providers at [facility] Does NOT accept Medicaid Only accepting babies of parents who are patients Please call early in hospitalization for appointment (limited availability) [facility] Gay, MD; Quinlan, MD [address] [place], [place] [address] [phone] (press 1 to schedule appointment) Mon-Fri 8:00-5:00 Providers come to see babies at [facility] Does NOT accept Medicaid [facility] Mazer, MD [address] [place], [place] [address] [phone] Mon-Fri 8:30-5:00 (lunch 12:30-1:00), extended hours by appointment only Wed 5:00-6:30 Babies seen by [facility] providers Accepting Medicaid [facility] Banks, MD; Jordan, MD; Koberlein, MD [address], [place], [place] [address] [phone] Mon-Fri 8:00-5:00 Babies seen by [facility] providers Does NOT accept Medicaid [facility] Parker, MD; Hunter, MD; Wallace, DO [address] [place], [place] [address] [phone] Mon-Fri 8:00-5:00 Babies seen by [facility] providers Does NOT accept Medicaid [facility] Brandon, PA; Brecken, PA; Christy, NP; Dees, MD; DeClaire, MD; DeWeese, MD; Hansen, NP; Mills, NP; Parrish, NP; Smoot, NP; Summer, MD; Vapne,  MD [address] [place], [place] [address] [phone] Mon-Fri 8:30-5:00, Sat 10:00-1:00 Providers come to see babies at [facility] Does NOT accept Medicaid Free prenatal information session Tuesdays at 4:45pm [facility] Bouska, MD; Gordon, PA; Jeffery, PA; Weber, PA [address] [place] [address] [phone] Mon-Fri 7:30-5:30 Babies seen by [facility] providers [facility] [address], [place], [place]  [address] [phone]   Fax - [phone]  [place] ([address] & [address]) [facility] [place] [address] [place], [place] [address] [phone] Mon-Thur 8:00-6:00 Providers come to see babies at [facility] Accepting Medicaid [facility] Anderson, NP; Badger, MD; Beal, PA; Spencer, PA [address] [place], [place] [address] [phone] Mon-Thur 7:30-7:30, Fri 7:30-4:30 Babies seen by [facility] providers Accepting Medicaid [facility] Agbuya, MD; Klett, NP; Romgoolam, MD [address], [place], [place] [address] [phone] Mon-Fri 8:30-5:00, Sat 8:30-12:00 Providers come to see babies at [facility] Accepting Medicaid Must have "Meet & Greet" appointment at office prior to delivery [facility] ([facility] McCord, MD; Wallace, MD; Wood, MD [address], [place], [place] [address] [phone] Mon-Wed 8:00-6:00, Thur-Fri 8:00-5:00, Sat 9:00-12:00 Providers come to see babies at [facility] Does NOT accept Medicaid Only accepting siblings of current patients [facility]  [address], [place], [place]  [address] [phone]   Fax - [phone] [facility] [address], [place], [place]  [address] [phone]   Fax -  [phone]  [place] ([address] & [address]) [facility] Cirigliano, DO; Matthews, DO [address] [place], [place] [address] [phone] Mon-Fri 7:00-5:00 Babies seen by [facility] providers Does NOT accept Medicaid [facility] Briscoe, MD; Howley, PA; Moreira, PA [address], [place], [place] [address] [phone] Mon-Fri 8:00-5:00 Babies seen by [facility] providers Accepting Medicaid [facility] Boyd, MD; Church, PA; Jones, NP; Osborn, PA [address], , [place] [address] [phone] Mon-Fri 8:00-5:00 Babies seen by providers at [facility] Accepting Medicaid  [place] ([address]) [facility] [place], DO [address] [place], [place] [address] [phone] Mon-Fri 8:00-5:00 Babies seen by [facility] providers Does NOT accept Medicaid Limited availability, please call early in hospitalization to schedule follow-up [facility] Calderon, PA; Cummings, MD; Dillard, MD; Martin, PA; Olson, MD; VanDeven, PA [address], [place], [place] [address] [phone] Mon-Fri 8:30-5:00, Sat 9:00-12:00 Babies seen by providers at [facility] Accepting Medicaid Please register online then schedule online or call office [email or web address] Hunter, NP; Kumar, MD; Martin Rogers, PA [address], [place], [place] [address] [phone] Mon-Fri 8:00-5:00 Babies seen by providers at [facility] Accepting Medicaid [facility] ([facility]) Tecumseh, MD; Kristi Fleenor, NP; West, MD [address], [place], [place] [address] [phone] Mon-Fri 8:00-5:30, Sat&Sun by appointment (phones open at  8:30) Babies seen by [facility] providers Accepting Medicaid Must be a first-time baby or sibling of current patient [facility] [place]  [address], [place], [place]  [address] [phone]   Fax - [phone]  [place] ([address]) [facility] Brown, PA; Cowen, PA; Rice, MD; Helton, PA; Spry, MD [address] [place], [place] [address] [phone] Mon-Thur 8:00-7:00, Fri 8:00-5:00, Sat 8:00-12:00, Sun 9:00-12:00 Babies seen by [facility] providers Accepting Medicaid [facility] [place] Coe-Goins, MD; Marshall, MD; Pierre-Louis, MD [address] [place] [address], [place], [place] [address] [phone] Mon-Thur 8:00-5:00 Babies seen by providers at [facility] Accepting Medicaid [facility] Bratton, MD; Coe-Goins, MD; Hayes, MD; Lewis, MD; List, MD; Lott, MD; Marshall, MD; Moran, MD; O'Neal, MD; Pierre-Louis, MD; Pitonzo, MD; Scholer, MD; Spangle, MD [address] [place], [place] [address] [phone] Mon-Fri 8:00-5:30, Sat (Oct.-Mar.) 9:00-1:00 Babies seen by providers at [facility] Accepting Medicaid Must fill out new patient packet, available online at [email or web address] [facility] ([facility]) Friddle, NP; Harris, NP; Kelly, NP; Logan, MD; Melvin, PA; Poth, MD; Ramadoss, MD; Stanton, NP [address], [place], [place] [address] [phone] Mon-Thur 8:00-5:30, Fri 8:00-5:00 Babies seen by providers at [facility] Accepting Medicaid  [place] ([address]) [facility] Dixon, PA; Brier, MD; Pickard, MD; Tapia, PA [address], [place], [place] [address] [phone] Mon-Fri 8:00-5:00 Babies seen by providers at [facility] Accepting Medicaid   [place] ([address]) [facility] [place] Masneri, DO; Meyers, MD; Nelson, PA [address], [place], [place] [address] [phone] Mon-Fri 8:00-5:00 Babies seen by providers at [facility] Does NOT accept Medicaid Limited appointment availability, please call early in hospitalization  [facility] [place] Kunedd, DO; McGowen, MD [address], [place], [place] [address] [phone] Mon-Fri 8:00-5:00 Babies seen by [facility] providers Does NOT accept Medicaid [facility] [place] Cameron, MD; MacDonald, MD; Michaels, PA; Nayak, MD [address] [place] [address], [place], [place] [address] [phone] Mon-Fri 8:00-5:00 After hours clinic ([address] [place], [place] [address]) [phone] Mon-Fri 5:00-8:00, Sat 12:00-6:00, Sun 10:00-4:00 Babies seen by [facility] providers Accepting Medicaid [facility] [place] [address], [place], [place]  [address] [phone]   Fax - [phone]  [place] ([address]) [facility] [place] [facility] Andy, MD [address], [place], [place] [address] [phone] Mon-Fri 8:00-5:00 Babies seen by [facility] providers Does NOT accept Medicaid [facility] [place] ([facility] [place]) Eksir, MD [address], [place], [place] [address] [phone] Mon-Thur 8:00-7:00, Fri 8:00-5:00, Sat 8:00-12:00 Babies seen by providers at [facility] Accepting Medicaid - but does not have vaccinations in office (must be received elsewhere) Limited availability, please call early in hospitalization  [place] ([address]) [facility]  Charlene Flemming, MD [address], [place] [address] [phone]  Fax [phone]  [facility]  Kimberly Newton, MD, Annamarie Streilein, PA, Carla [place], PA [address] [place], [place]  [address] [phone] [facility]  [address], [place], [place] [address] [phone] [address], [place], [place] [address] [phone] (West Office)  [facility] [address], [place], [place] [address] [phone] [facility] [address] [place], [place], [place] [address] [phone] [facility] [address], [place], [place] [address] [phone] [facility] [address], [place], [place] [address] [phone] [facility] [address], [place], [place] [address] [phone] [facility] [address], [place], [place] [address] [phone] [facility]  [address], [place], [place] [address] [phone] Dr[facility] [address] [place] [address], [place], [place] [address] [phone] [facility] [address], [place], [place] [address] [phone] [facility] [address], , [place] [address] [phone]  

## 2022-02-17 NOTE — Progress Notes (Signed)
   PRENATAL VISIT NOTE  Subjective:  Kristen [name] is a 36 y.o. G6P5005 at [redacted]w[redacted]d being seen today for ongoing prenatal care.  She is currently monitored for the following issues for this high-risk pregnancy and has Supervision of high risk pregnancy, antepartum; History of gestational diabetes in prior pregnancy, currently pregnant; Antepartum multigravida of advanced maternal age; Horseshoe kidney; Kidney stones; Umbilical hernia without obstruction and without gangrene; and Gestational diabetes mellitus on their problem list.  Patient reports no complaints.  Contractions: Not present. Vag. Bleeding: None.  Movement: Present. Denies leaking of fluid.   The following portions of the patient's history were reviewed and updated as appropriate: allergies, current medications, past family history, past medical history, past social history, past surgical history and problem list.   Objective:   Vitals:   02/17/22 1057  BP: 110/69  Pulse: 72  Weight: 141 lb (64 kg)    Fetal Status: Fetal Heart Rate (bpm): 157 Fundal Height: 21 cm Movement: Present     General:  Alert, oriented and cooperative. Patient is in no acute distress.  Skin: Skin is warm and dry. No rash noted.   Cardiovascular: Normal heart rate noted  Respiratory: Normal respiratory effort, no problems with respiration noted  Abdomen: Soft, gravid, appropriate for gestational age.  Pain/Pressure: Absent     Pelvic: Cervical exam deferred        Extremities: Normal range of motion.  Edema: None  Mental Status: Normal mood and affect. Normal behavior. Normal judgment and thought content.   Assessment and Plan:  Pregnancy: [id number] at [redacted]w[redacted]d 1. Supervision of high risk pregnancy, antepartum  2. Gestational diabetes mellitus (GDM) in second trimester, gestational diabetes method of control unspecified - Has diabetes education scheduled  - Advised to bring CBG log to every visit  - Growth [place] scheduled with MFM   3. Antepartum  multigravida of advanced maternal age - < 87, no change   4. [redacted] weeks gestation of pregnancy  - Pt is interested in Microbiologist.  No contraindications at this time per chart review/patient assessment.   - Pt has taken class, see CNMs for most visits in the office.  - Discussed waterbirth as option for low-risk pregnancy.  Reviewed conditions that may arise during pregnancy that will risk pt out of waterbirth including hypertension, diabetes, fetal growth restriction <10%ile, etc.   Preterm labor symptoms and general obstetric precautions including but not limited to vaginal bleeding, contractions, leaking of fluid and fetal movement were reviewed in detail with the patient. Please refer to After Visit Summary for other counseling recommendations.   Return in about 4 weeks (around 03/17/2022) for LOB, In-Person, any provider.  Future Appointments  Date Time Provider [place]  02/19/2022  1:15 PM [facility]  03/16/2022  3:30 PM [facility] NURSE [facility]  03/16/2022  3:45 PM [facility] US1 [facility]  03/18/2022  1:10 PM Nehemiah Settle, Tanna Savoy, DO [facility] None    Kerry Hough, PA-C

## 2022-02-19 ENCOUNTER — Ambulatory Visit (INDEPENDENT_AMBULATORY_CARE_PROVIDER_SITE_OTHER): Payer: Medicaid Other | Admitting: Registered"

## 2022-02-19 ENCOUNTER — Other Ambulatory Visit: Payer: Self-pay

## 2022-02-19 ENCOUNTER — Encounter: Payer: Medicaid Other | Attending: Family Medicine | Admitting: Registered"

## 2022-02-19 DIAGNOSIS — O24419 Gestational diabetes mellitus in pregnancy, unspecified control: Secondary | ICD-10-CM

## 2022-02-19 DIAGNOSIS — Z713 Dietary counseling and surveillance: Secondary | ICD-10-CM | POA: Diagnosis not present

## 2022-02-19 MED ORDER — ACCU-CHEK SOFTCLIX LANCETS MISC: 1.0000 | Freq: Four times a day (QID) | 12 refills | Status: DC

## 2022-02-19 MED ORDER — ACCU-CHEK GUIDE W/DEVICE KIT: 1.0000 | PACK | Freq: Four times a day (QID) | 0 refills | Status: DC

## 2022-02-19 MED ORDER — ACCU-CHEK GUIDE VI STRP: ORAL_STRIP | 12 refills | Status: DC

## 2022-02-19 NOTE — Progress Notes (Signed)
Patient was seen for Gestational Diabetes self-management on 02/19/22  Start time 1415 and End time 1408   Estimated due date: 07/01/22; [redacted]w[redacted]d  Pt receives care at [facility] location, [facility]  Clinical: Medications: going to take iron and folic acid Medical History: GDM 3 yr ago Labs: OGTT [phone], A1c 5.8%   Dietary and Lifestyle History: Pt states she has a family history of type 2 diabetes. Pt states she is aware of her genetic predisposition and works to have a healthy diet and lifestyle.  Physical Activity: 45 min 7x/week Stress: not assessed Sleep: disturbed by frequent urination and discomfort  24 hr Recall:  First Meal: oatmeal made with 1/2 milk 1/2 water, cinnamon, black coffee, water Snack: none Second meal:peanut butter and jelly sandwich on whole wheat, water Snack: apples and peanut butter Third meal: sweet potato, baked chicken Snack: none Beverages: water, 1 c coffee  NUTRITION INTERVENTION  Nutrition education (E-1) on the following topics:   Initial Follow-up  [x]  []  Definition of Gestational Diabetes [x]  []  Why dietary management is important in controlling blood glucose [x]  []  Effects each nutrient has on blood glucose levels [x]  []  Simple carbohydrates vs complex carbohydrates [x]  []  Fluid intake [x]  []  Creating a balanced meal plan [x]  []  Carbohydrate counting  [x]  []  When to check blood glucose levels [x]  []  Proper blood glucose monitoring techniques [x]  []  Effect of stress and stress reduction techniques  [x]  []  Exercise effect on blood glucose levels, appropriate exercise during pregnancy [x]  []  Importance of limiting caffeine and abstaining from alcohol and smoking [x]  []  Medications used for blood sugar control during pregnancy [x]  []  Hypoglycemia and rule of 15 [x]  []  Postpartum self care  Blood glucose monitor given: Accu-chek Guide Me Lot Exp: 12/03/2022 CBG: 134 mg/dL - 30 min after lunch  Patient instructed to monitor  glucose levels: FBS: 60 - ? 95 mg/dL (some clinics use 90 for cutoff) 1 hour: ? 140 mg/dL 2 hour: ? mg/dL  Patient received handouts: Nutrition Diabetes and Pregnancy Carbohydrate Counting List  Patient will be seen for follow-up as needed.

## 2022-03-16 ENCOUNTER — Ambulatory Visit: Payer: Medicaid Other | Admitting: *Deleted

## 2022-03-16 ENCOUNTER — Ambulatory Visit: Payer: Medicaid Other | Attending: Obstetrics and Gynecology

## 2022-03-16 ENCOUNTER — Encounter: Payer: Self-pay | Admitting: *Deleted

## 2022-03-16 DIAGNOSIS — Z362 Encounter for other antenatal screening follow-up: Secondary | ICD-10-CM | POA: Diagnosis not present

## 2022-03-16 DIAGNOSIS — O24410 Gestational diabetes mellitus in pregnancy, diet controlled: Secondary | ICD-10-CM | POA: Insufficient documentation

## 2022-03-16 DIAGNOSIS — O0942 Supervision of pregnancy with grand multiparity, second trimester: Secondary | ICD-10-CM

## 2022-03-16 DIAGNOSIS — Z3A24 24 weeks gestation of pregnancy: Secondary | ICD-10-CM

## 2022-03-16 DIAGNOSIS — O09522 Supervision of elderly multigravida, second trimester: Secondary | ICD-10-CM

## 2022-03-18 ENCOUNTER — Encounter: Payer: Medicaid Other | Admitting: Family Medicine

## 2022-03-18 ENCOUNTER — Encounter: Payer: Self-pay | Admitting: General Practice

## 2022-03-18 ENCOUNTER — Other Ambulatory Visit: Payer: Self-pay | Admitting: *Deleted

## 2022-03-18 DIAGNOSIS — O24410 Gestational diabetes mellitus in pregnancy, diet controlled: Secondary | ICD-10-CM

## 2022-03-26 ENCOUNTER — Ambulatory Visit (INDEPENDENT_AMBULATORY_CARE_PROVIDER_SITE_OTHER): Payer: Medicaid Other | Admitting: Family Medicine

## 2022-03-26 VITALS — BP 108/75 | HR 99 | Wt 148.0 lb

## 2022-03-26 DIAGNOSIS — Z3A26 26 weeks gestation of pregnancy: Secondary | ICD-10-CM

## 2022-03-26 DIAGNOSIS — O09529 Supervision of elderly multigravida, unspecified trimester: Secondary | ICD-10-CM

## 2022-03-26 DIAGNOSIS — O09522 Supervision of elderly multigravida, second trimester: Secondary | ICD-10-CM

## 2022-03-26 DIAGNOSIS — O24410 Gestational diabetes mellitus in pregnancy, diet controlled: Secondary | ICD-10-CM

## 2022-03-26 DIAGNOSIS — O0990 Supervision of high risk pregnancy, unspecified, unspecified trimester: Secondary | ICD-10-CM

## 2022-03-26 DIAGNOSIS — O0992 Supervision of high risk pregnancy, unspecified, second trimester: Secondary | ICD-10-CM

## 2022-03-26 NOTE — Progress Notes (Signed)
   PRENATAL VISIT NOTE  Subjective:  Kristen [name] is a 36 y.o. G6P5005 at [redacted]w[redacted]d being seen today for ongoing prenatal care.  She is currently monitored for the following issues for this high-risk pregnancy and has Supervision of high risk pregnancy, antepartum; History of gestational diabetes in prior pregnancy, currently pregnant; Antepartum multigravida of advanced maternal age; Horseshoe kidney; Kidney stones; Umbilical hernia without obstruction and without gangrene; and Gestational diabetes mellitus on their problem list.  Patient reports no complaints.  Contractions: Irritability. Vag. Bleeding: None.  Movement: Present. Denies leaking of fluid.   The following portions of the patient's history were reviewed and updated as appropriate: allergies, current medications, past family history, past medical history, past social history, past surgical history and problem list.   Objective:   Vitals:   03/26/22 0855  BP: 108/75  Pulse: 99  Weight: 148 lb (67.1 kg)    Fetal Status: Fetal Heart Rate (bpm): 146   Movement: Present     General:  Alert, oriented and cooperative. Patient is in no acute distress.  Skin: Skin is warm and dry. No rash noted.   Cardiovascular: Normal heart rate noted  Respiratory: Normal respiratory effort, no problems with respiration noted  Abdomen: Soft, gravid, appropriate for gestational age.  Pain/Pressure: Present     Pelvic: Cervical exam deferred        Extremities: Normal range of motion.  Edema: None  Mental Status: Normal mood and affect. Normal behavior. Normal judgment and thought content.   Assessment and Plan:  Pregnancy: G6P5005 at [redacted]w[redacted]d 1. [redacted] weeks gestation of pregnancy - CBC - HIV Antibody (routine testing w rflx) - RPR  2. Supervision of high risk pregnancy, antepartum FHT and FH normal - CBC - HIV Antibody (routine testing w rflx) - RPR  3. Antepartum multigravida of advanced maternal age ASA 81mg   4. Diet controlled gestational  diabetes mellitus (GDM) in second trimester CBGs low 90s fasting. 2hr PP controlled. 12/4 - EFW 44%. Serial 14/4 for growth due to early GDM.   Preterm labor symptoms and general obstetric precautions including but not limited to vaginal bleeding, contractions, leaking of fluid and fetal movement were reviewed in detail with the patient. Please refer to After Visit Summary for other counseling recommendations.   No follow-ups on file.  Future Appointments  Date Time Provider Department Center  04/09/2022  9:35 AM 04/11/2022, DO [facility] None  04/20/2022  7:30 AM [facility] NURSE [facility]  04/20/2022  7:45 AM [facility] US5 [facility]  04/23/2022  8:15 AM 06/22/2022, DO [facility] None  05/07/2022  8:15 AM 05/09/2022, DO [facility] None  05/21/2022  8:15 AM 07/20/2022, DO [facility] None  06/04/2022  8:15 AM 06/06/2022, DO [facility] None  06/11/2022  8:15 AM 06/13/2022 Adrian Blackwater, DO [facility] None    Rhona Raider, DO

## 2022-03-26 NOTE — Progress Notes (Signed)
  History:  Ms. Kristen [name] is a 36 y.o. [id number] who presents to clinic today for routine prenatal care.  She is currently monitored for the following issues for this high-risk pregnancy and has Supervision of high risk pregnancy, antepartum; History of gestational diabetes in prior pregnancy, currently pregnant; Antepartum multigravida of advanced maternal age; Horseshoe kidney; Kidney stones; Umbilical hernia without obstruction and without gangrene; and Gestational diabetes mellitus on their problem list.   Patient reports no complaints.  Contractions: Uterine Irritability. Vag. Bleeding: None.  Movement: Present. Denies leaking of fluid. Is interested in water birth. Staying active, reporting BG 93 fasting and 90 2 hour postprandial.   The following portions of the patient's history were reviewed and updated as appropriate: allergies, current medications, family history, past medical history, social history, past surgical history and problem list.  Review of Systems:  Review of Systems  Constitutional: Negative.   Eyes: Negative.   Cardiovascular: Negative.   Gastrointestinal: Negative.   Genitourinary: Negative.   Musculoskeletal:  Positive for back pain.  Skin: Negative.   Neurological: Negative.   Psychiatric/Behavioral: Negative.        Objective:  Physical Exam BP 108/75   Pulse 99   Wt 148 lb (67.1 kg)   LMP 09/24/2021 (Exact Date)   BMI 27.96 kg/m  Physical Exam Constitutional:      General: She is not in acute distress. Cardiovascular:     Rate and Rhythm: Normal rate and regular rhythm.  Pulmonary:     Effort: Pulmonary effort is normal.     Breath sounds: Normal breath sounds.  Skin:    General: Skin is warm.  Neurological:     General: No focal deficit present.     Mental Status: She is alert.  Psychiatric:        Mood and Affect: Mood normal.       Labs and Imaging No results found for this or any previous visit (from the past 24 hour(s)).  No  results found.  Health Maintenance Due  Topic Date Due   COVID-19 Vaccine (1) Never done   FOOT EXAM  Never done   OPHTHALMOLOGY EXAM  Never done   Diabetic kidney evaluation - eGFR measurement  Never done   Diabetic kidney evaluation - Urine ACR  Never done   DTaP/Tdap/Td (1 - Tdap) Never done   INFLUENZA VACCINE  Never done    Labs, imaging and previous visits in Epic and Care Everywhere reviewed  Assessment & Plan:  1. [redacted] weeks gestation of pregnancy Fetal measurement appropriate - CBC - HIV Antibody (routine testing w rflx) - RPR  2. Gestational diabetes mellitus (GDM) in second trimester, gestational diabetes method of control unspecified Reported CBG fasting and postprandial wnl, encouraged continued exercise and diet management 12/04 [place] normal Counseled on possible contraindication to water birth due to GDM - Growth [place] scheduled with MFM 1/08  Preterm labor symptoms and general obstetric precautions including but not limited to vaginal bleeding, contractions, leaking of fluid and fetal movement were reviewed in detail with the patient.    Approximately 20 minutes of total time was spent with this patient on 12/14  Fleeta Emmer, Medical Student 03/26/2022 9:25 AM

## 2022-03-27 LAB — CBC
Hematocrit: 34.4 % (ref 34.0–46.6)
Hemoglobin: 11.4 g/dL (ref 11.1–15.9)
MCH: 29.8 pg (ref 26.6–33.0)
MCHC: 33.1 g/dL (ref 31.5–35.7)
MCV: 90 fL (ref 79–97)
Platelets: 259 10*3/uL (ref 150–450)
RBC: 3.83 x10E6/uL (ref 3.77–5.28)
RDW: 12.4 % (ref 11.7–15.4)
WBC: 7.6 10*3/uL (ref 3.4–10.8)

## 2022-03-27 LAB — HIV ANTIBODY (ROUTINE TESTING W REFLEX): HIV Screen 4th Generation wRfx: NONREACTIVE

## 2022-03-27 LAB — RPR: RPR Ser Ql: NONREACTIVE

## 2022-04-09 ENCOUNTER — Ambulatory Visit (INDEPENDENT_AMBULATORY_CARE_PROVIDER_SITE_OTHER): Payer: Medicaid Other | Admitting: Family Medicine

## 2022-04-09 VITALS — BP 118/74 | HR 86 | Wt 153.0 lb

## 2022-04-09 DIAGNOSIS — O24410 Gestational diabetes mellitus in pregnancy, diet controlled: Secondary | ICD-10-CM

## 2022-04-09 DIAGNOSIS — O0993 Supervision of high risk pregnancy, unspecified, third trimester: Secondary | ICD-10-CM

## 2022-04-09 DIAGNOSIS — O09529 Supervision of elderly multigravida, unspecified trimester: Secondary | ICD-10-CM

## 2022-04-09 DIAGNOSIS — O0990 Supervision of high risk pregnancy, unspecified, unspecified trimester: Secondary | ICD-10-CM

## 2022-04-09 DIAGNOSIS — Z3A28 28 weeks gestation of pregnancy: Secondary | ICD-10-CM

## 2022-04-09 DIAGNOSIS — O09523 Supervision of elderly multigravida, third trimester: Secondary | ICD-10-CM

## 2022-04-09 NOTE — Progress Notes (Signed)
   PRENATAL VISIT NOTE  Subjective:  Kristen [name] is a 36 y.o. G6P5005 at [redacted]w[redacted]d being seen today for ongoing prenatal care.  She is currently monitored for the following issues for this high-risk pregnancy and has Supervision of high risk pregnancy, antepartum; History of gestational diabetes in prior pregnancy, currently pregnant; Antepartum multigravida of advanced maternal age; Horseshoe kidney; Kidney stones; Umbilical hernia without obstruction and without gangrene; and Gestational diabetes mellitus on their problem list.  Patient reports no complaints.  Contractions: Regular.  .  Movement: Present. Denies leaking of fluid.   The following portions of the patient's history were reviewed and updated as appropriate: allergies, current medications, past family history, past medical history, past social history, past surgical history and problem list.   Objective:   Vitals:   04/09/22 0935  BP: 118/74  Pulse: 86  Weight: 153 lb (69.4 kg)    Fetal Status: Fetal Heart Rate (bpm): 161   Movement: Present     General:  Alert, oriented and cooperative. Patient is in no acute distress.  Skin: Skin is warm and dry. No rash noted.   Cardiovascular: Normal heart rate noted  Respiratory: Normal respiratory effort, no problems with respiration noted  Abdomen: Soft, gravid, appropriate for gestational age.  Pain/Pressure: Present     Pelvic: Cervical exam deferred        Extremities: Normal range of motion.     Mental Status: Normal mood and affect. Normal behavior. Normal judgment and thought content.   Assessment and Plan:  Pregnancy: G6P5005 at [redacted]w[redacted]d 1. [redacted] weeks gestation of pregnancy  2. Supervision of high risk pregnancy, antepartum Would like waterbirth - I've discussed with CNM - okay as long as labors.  3. Antepartum multigravida of advanced maternal age ASA 81mg   4. Diet controlled gestational diabetes mellitus (GDM) in second trimester Controlled without meds.   Preterm  labor symptoms and general obstetric precautions including but not limited to vaginal bleeding, contractions, leaking of fluid and fetal movement were reviewed in detail with the patient. Please refer to After Visit Summary for other counseling recommendations.   No follow-ups on file.  Future Appointments  Date Time Provider Department Center  04/20/2022  7:30 AM [facility] NURSE [facility]  04/20/2022  7:45 AM [facility] US5 [facility]  04/23/2022  8:15 AM 06/22/2022, DO [facility] None  05/07/2022  8:15 AM 05/09/2022, DO [facility] None  05/21/2022  8:15 AM 07/20/2022, DO [facility] None  06/04/2022  8:15 AM 06/06/2022, DO [facility] None  06/11/2022  8:15 AM 06/13/2022 Adrian Blackwater, DO [facility] None    Rhona Raider, DO

## 2022-04-13 NOTE — L&D Delivery Note (Addendum)
   Delivery Note:   [id number] at [redacted]w[redacted]d  Admitting diagnosis: Encounter for induction of labor [Z34.90] Risks: A1GDM  Grand Multip GBS + Polyhydraminos  First Stage:  Induction of labor:06/24/22 Onset of labor: 06/24/22  @ 1146 Augmentation: Pitocin ROM: AROM 06/24/22 @ 1146 Active labor onset: @ 1146 Analgesia /Anesthesia/Pain control intrapartum: Epidural   Second Stage:  Complete dilation at 06/24/2022  @ 1331 Onset of pushing at @ 1331 FHR second stage Cat II. Moderate variability, accels present. Variable decels with contractions and quick return to baseline.    CNM called to patient bedside. Patient Pushing in lithotomy position with CNM and L&D staff support at bedside. FOB "Merrilee Seashore", and Nick's mother present for birth and supportive.   Delivery of a Live born female  Birth Weight:  PENDING  APGAR: 53, 9  Newborn Delivery   Birth date/time: 06/24/2022 13:47:58 Delivery type: Vaginal, Spontaneous     With maternal pushing efforts Fetal head delivered in cephalic presentation, DOA position and spontaneously restituted  to LOA. No nuchal cord identified. Encouraged mom to push. On next contraction with maternal pushing efforts, no shoulders observed progressing. Head of bed leaned back and mom encouraged to push again. CNM grasped the posterior axilla and with maternal pushing efforts, fetal body spontaneously rotated to ROA and delivered with ease. At no time during delivery was downward force or traction applied to fetal head.  Vigorously crying infant placed immediately skin to skin with mom. Nuchal Cord: Yes    After 5 mins of life cord double clamped after cessation of pulsation, and cut by Merrilee Seashore.  Collection of cord blood for typing completed. Cord blood donation-None  Arterial cord blood sample-No    Third Stage:  With gentle cord traction and maternal pushing efforts Placenta delivered spontaneously -intact  with 3 vessels . Uterine tone firm bleeding  minimal Uterotonics: IV pit bolus initiated  Placenta to L&D for disposal.  No  laceration identified.  Episiotomy:None  Local analgesia: n/a   Repair:N/A  Est. Blood Loss (mL): 40HK  Complications: None   Mom to postpartum.  Baby boy "Cristela Blue" to Couplet care / Skin to Skin.  Delivery Report:  Review the Delivery Report for details.    Jaceon Heiberger Isaias Sakai) Rollene Rotunda, MSN, [place] [facility] [place]  06/24/22 @ 2:14 PM

## 2022-04-20 ENCOUNTER — Ambulatory Visit: Payer: Medicaid Other | Attending: Obstetrics and Gynecology

## 2022-04-20 ENCOUNTER — Ambulatory Visit: Payer: Medicaid Other

## 2022-04-23 ENCOUNTER — Encounter: Payer: Medicaid Other | Admitting: Family Medicine

## 2022-05-06 ENCOUNTER — Ambulatory Visit: Payer: Medicaid Other | Admitting: *Deleted

## 2022-05-06 ENCOUNTER — Ambulatory Visit: Payer: Medicaid Other | Attending: Obstetrics and Gynecology

## 2022-05-06 ENCOUNTER — Other Ambulatory Visit: Payer: Self-pay | Admitting: *Deleted

## 2022-05-06 VITALS — BP 118/64 | HR 86

## 2022-05-06 DIAGNOSIS — O24410 Gestational diabetes mellitus in pregnancy, diet controlled: Secondary | ICD-10-CM

## 2022-05-06 DIAGNOSIS — O403XX0 Polyhydramnios, third trimester, not applicable or unspecified: Secondary | ICD-10-CM

## 2022-05-06 DIAGNOSIS — O0943 Supervision of pregnancy with grand multiparity, third trimester: Secondary | ICD-10-CM

## 2022-05-06 DIAGNOSIS — Z3A32 32 weeks gestation of pregnancy: Secondary | ICD-10-CM

## 2022-05-06 DIAGNOSIS — O09523 Supervision of elderly multigravida, third trimester: Secondary | ICD-10-CM | POA: Diagnosis not present

## 2022-05-07 ENCOUNTER — Ambulatory Visit (INDEPENDENT_AMBULATORY_CARE_PROVIDER_SITE_OTHER): Payer: Medicaid Other | Admitting: Family Medicine

## 2022-05-07 VITALS — BP 111/68 | HR 92 | Wt 155.0 lb

## 2022-05-07 DIAGNOSIS — O0990 Supervision of high risk pregnancy, unspecified, unspecified trimester: Secondary | ICD-10-CM

## 2022-05-07 DIAGNOSIS — Z3A32 32 weeks gestation of pregnancy: Secondary | ICD-10-CM

## 2022-05-07 DIAGNOSIS — O403XX1 Polyhydramnios, third trimester, fetus 1: Secondary | ICD-10-CM

## 2022-05-07 DIAGNOSIS — O24410 Gestational diabetes mellitus in pregnancy, diet controlled: Secondary | ICD-10-CM

## 2022-05-07 DIAGNOSIS — O409XX0 Polyhydramnios, unspecified trimester, not applicable or unspecified: Secondary | ICD-10-CM | POA: Insufficient documentation

## 2022-05-07 DIAGNOSIS — O09529 Supervision of elderly multigravida, unspecified trimester: Secondary | ICD-10-CM

## 2022-05-07 DIAGNOSIS — O09523 Supervision of elderly multigravida, third trimester: Secondary | ICD-10-CM

## 2022-05-07 LAB — POCT URINALYSIS DIPSTICK OB
Bilirubin, UA: NEGATIVE
Blood, UA: NEGATIVE
Glucose, UA: NEGATIVE
Ketones, UA: NEGATIVE
Leukocytes, UA: NEGATIVE
Nitrite, UA: NEGATIVE
POC,PROTEIN,UA: NEGATIVE
Spec Grav, UA: 1.02 (ref 1.010–1.025)
Urobilinogen, UA: 0.2 E.U./dL
pH, UA: 6 (ref 5.0–8.0)

## 2022-05-07 NOTE — Progress Notes (Signed)
   PRENATAL VISIT NOTE  Subjective:  Kristen [name] is a 37 y.o. G6P5005 at [redacted]w[redacted]d being seen today for ongoing prenatal care.  She is currently monitored for the following issues for this high-risk pregnancy and has Supervision of high risk pregnancy, antepartum; History of gestational diabetes in prior pregnancy, currently pregnant; Antepartum multigravida of advanced maternal age; Horseshoe kidney; Kidney stones; Umbilical hernia without obstruction and without gangrene; Gestational diabetes mellitus; and Polyhydramnios affecting pregnancy on their problem list.  Patient reports no complaints.  Contractions: Not present. Vag. Bleeding: None.  Movement: Present. Denies leaking of fluid.   The following portions of the patient's history were reviewed and updated as appropriate: allergies, current medications, past family history, past medical history, past social history, past surgical history and problem list.   Objective:   Vitals:   05/07/22 0810  BP: 111/68  Pulse: 92  Weight: 155 lb (70.3 kg)    Fetal Status: Fetal Heart Rate (bpm): 142   Movement: Present     General:  Alert, oriented and cooperative. Patient is in no acute distress.  Skin: Skin is warm and dry. No rash noted.   Cardiovascular: Normal heart rate noted  Respiratory: Normal respiratory effort, no problems with respiration noted  Abdomen: Soft, gravid, appropriate for gestational age.  Pain/Pressure: Absent     Pelvic: Cervical exam deferred        Extremities: Normal range of motion.  Edema: None  Mental Status: Normal mood and affect. Normal behavior. Normal judgment and thought content.   Assessment and Plan:  Pregnancy: G6P5005 at [redacted]w[redacted]d 1. [redacted] weeks gestation of pregnancy - POC Urinalysis Dipstick OB  2. Supervision of high risk pregnancy, antepartum FHT and FH normal - POC Urinalysis Dipstick OB  3. Antepartum multigravida of advanced maternal age ASA 81mg   4. Diet controlled gestational diabetes  mellitus (GDM) in third trimester Continues to be diet controlled - reports numbers in range EFW 68% AC 91% Mild Poly. Still anticipate delivery by 40 wks. - POC Urinalysis Dipstick OB  5. Polyhydramnios affecting pregnancy Mild poly.  Preterm labor symptoms and general obstetric precautions including but not limited to vaginal bleeding, contractions, leaking of fluid and fetal movement were reviewed in detail with the patient. Please refer to After Visit Summary for other counseling recommendations.   No follow-ups on file.  Future Appointments  Date Time Provider [place]  05/21/2022  8:15 AM Truett Mainland, DO [facility] None  06/03/2022  7:15 AM [facility] NURSE [facility]  06/03/2022  7:30 AM [facility] US3 [facility]  06/04/2022  8:15 AM Truett Mainland, DO [facility] None  06/11/2022  8:15 AM Truett Mainland, DO [facility] None    Truett Mainland, DO

## 2022-05-18 ENCOUNTER — Encounter: Payer: Self-pay | Admitting: Family Medicine

## 2022-05-18 NOTE — Telephone Encounter (Signed)
Called patient - having contractions. Feel fairly mild. Had sex 2 days ago had some contractions after that. A little more uncomfortable with shooting vaginal pain now. Took a shower and seemed to help some.   Will watch contractions for now. Advised if increases, then come to hospital to be evaluated.  Truett Mainland, DO

## 2022-05-21 ENCOUNTER — Ambulatory Visit (INDEPENDENT_AMBULATORY_CARE_PROVIDER_SITE_OTHER): Payer: Medicaid Other | Admitting: Family Medicine

## 2022-05-21 VITALS — BP 116/75 | HR 92 | Wt 159.1 lb

## 2022-05-21 DIAGNOSIS — O0990 Supervision of high risk pregnancy, unspecified, unspecified trimester: Secondary | ICD-10-CM

## 2022-05-21 DIAGNOSIS — O403XX1 Polyhydramnios, third trimester, fetus 1: Secondary | ICD-10-CM

## 2022-05-21 DIAGNOSIS — O409XX0 Polyhydramnios, unspecified trimester, not applicable or unspecified: Secondary | ICD-10-CM

## 2022-05-21 DIAGNOSIS — O24410 Gestational diabetes mellitus in pregnancy, diet controlled: Secondary | ICD-10-CM

## 2022-05-21 DIAGNOSIS — O09529 Supervision of elderly multigravida, unspecified trimester: Secondary | ICD-10-CM

## 2022-05-21 DIAGNOSIS — Z3A34 34 weeks gestation of pregnancy: Secondary | ICD-10-CM

## 2022-05-21 DIAGNOSIS — O09523 Supervision of elderly multigravida, third trimester: Secondary | ICD-10-CM

## 2022-05-21 LAB — POCT URINALYSIS DIPSTICK OB
Bilirubin, UA: NEGATIVE
Blood, UA: NEGATIVE
Glucose, UA: NEGATIVE
Ketones, UA: NEGATIVE
Leukocytes, UA: NEGATIVE
Nitrite, UA: NEGATIVE
POC,PROTEIN,UA: NEGATIVE
Spec Grav, UA: 1.015 (ref 1.010–1.025)
Urobilinogen, UA: 0.2 E.U./dL
pH, UA: 5 (ref 5.0–8.0)

## 2022-05-21 NOTE — Progress Notes (Signed)
   PRENATAL VISIT NOTE  Subjective:  Kristen [name] is a 37 y.o. G6P5005 at [redacted]w[redacted]d being seen today for ongoing prenatal care.  She is currently monitored for the following issues for this high-risk pregnancy and has Supervision of high risk pregnancy, antepartum; History of gestational diabetes in prior pregnancy, currently pregnant; Antepartum multigravida of advanced maternal age; Horseshoe kidney; Kidney stones; Umbilical hernia without obstruction and without gangrene; Gestational diabetes mellitus; and Polyhydramnios affecting pregnancy on their problem list.  Patient reports occasional contractions.  Contractions: Irritability. Vag. Bleeding: None.  Movement: Present. Denies leaking of fluid.   The following portions of the patient's history were reviewed and updated as appropriate: allergies, current medications, past family history, past medical history, past social history, past surgical history and problem list.   Objective:   Vitals:   05/21/22 0811  BP: 116/75  Pulse: 92  Weight: 159 lb 1.9 oz (72.2 kg)    Fetal Status: Fetal Heart Rate (bpm): 157 Fundal Height: 37 cm Movement: Present  Presentation: Vertex  General:  Alert, oriented and cooperative. Patient is in no acute distress.  Skin: Skin is warm and dry. No rash noted.   Cardiovascular: Normal heart rate noted  Respiratory: Normal respiratory effort, no problems with respiration noted  Abdomen: Soft, gravid, appropriate for gestational age.  Pain/Pressure: Present     Pelvic: Cervical exam performed in the presence of a chaperone Dilation: Fingertip Effacement (%): Thick Station: -3  Extremities: Normal range of motion.  Edema: None  Mental Status: Normal mood and affect. Normal behavior. Normal judgment and thought content.   Assessment and Plan:  Pregnancy: G6P5005 at [redacted]w[redacted]d 1. [redacted] weeks gestation of pregnancy - POC Urinalysis Dipstick OB  2. Supervision of high risk pregnancy, antepartum FHT normal  3. Diet  controlled gestational diabetes mellitus (GDM) in third trimester CBGs controlled on diet EFW normal - POC Urinalysis Dipstick OB  4. Polyhydramnios affecting pregnancy Mild poly.  Having some contractions. Still planning delivery aroun 39-40 weeks  5. Antepartum multigravida of advanced maternal age   Preterm labor symptoms and general obstetric precautions including but not limited to vaginal bleeding, contractions, leaking of fluid and fetal movement were reviewed in detail with the patient. Please refer to After Visit Summary for other counseling recommendations.   No follow-ups on file.  Future Appointments  Date Time Provider [place]  06/03/2022  7:15 AM [facility] NURSE [facility]  06/03/2022  7:30 AM [facility] US3 [facility]  06/04/2022  8:15 AM Truett Mainland, DO [facility] None  06/11/2022  8:15 AM Nehemiah Settle Tanna Savoy, DO [facility] None    Truett Mainland, DO

## 2022-06-03 ENCOUNTER — Ambulatory Visit: Payer: Medicaid Other | Attending: Maternal & Fetal Medicine

## 2022-06-03 ENCOUNTER — Ambulatory Visit: Payer: Medicaid Other | Admitting: *Deleted

## 2022-06-03 VITALS — BP 117/69 | HR 91

## 2022-06-03 DIAGNOSIS — O0943 Supervision of pregnancy with grand multiparity, third trimester: Secondary | ICD-10-CM | POA: Diagnosis not present

## 2022-06-03 DIAGNOSIS — O403XX0 Polyhydramnios, third trimester, not applicable or unspecified: Secondary | ICD-10-CM

## 2022-06-03 DIAGNOSIS — O09523 Supervision of elderly multigravida, third trimester: Secondary | ICD-10-CM | POA: Diagnosis not present

## 2022-06-03 DIAGNOSIS — O24410 Gestational diabetes mellitus in pregnancy, diet controlled: Secondary | ICD-10-CM | POA: Insufficient documentation

## 2022-06-03 DIAGNOSIS — Z3A36 36 weeks gestation of pregnancy: Secondary | ICD-10-CM

## 2022-06-04 ENCOUNTER — Encounter: Payer: Self-pay | Admitting: General Practice

## 2022-06-04 ENCOUNTER — Encounter: Payer: Self-pay | Admitting: Family Medicine

## 2022-06-04 ENCOUNTER — Other Ambulatory Visit (HOSPITAL_COMMUNITY)
Admission: RE | Admit: 2022-06-04 | Discharge: 2022-06-04 | Disposition: A | Discharge status: Home / Self Care | Payer: Medicaid Other | Source: Ambulatory Visit | Attending: Family Medicine | Admitting: Family Medicine

## 2022-06-04 ENCOUNTER — Ambulatory Visit (INDEPENDENT_AMBULATORY_CARE_PROVIDER_SITE_OTHER): Payer: Medicaid Other | Admitting: Family Medicine

## 2022-06-04 VITALS — BP 114/78 | HR 96 | Wt 160.0 lb

## 2022-06-04 DIAGNOSIS — O24410 Gestational diabetes mellitus in pregnancy, diet controlled: Secondary | ICD-10-CM | POA: Diagnosis not present

## 2022-06-04 DIAGNOSIS — Z3A36 36 weeks gestation of pregnancy: Secondary | ICD-10-CM

## 2022-06-04 DIAGNOSIS — O0990 Supervision of high risk pregnancy, unspecified, unspecified trimester: Secondary | ICD-10-CM

## 2022-06-04 DIAGNOSIS — O403XX1 Polyhydramnios, third trimester, fetus 1: Secondary | ICD-10-CM

## 2022-06-04 DIAGNOSIS — O0993 Supervision of high risk pregnancy, unspecified, third trimester: Secondary | ICD-10-CM | POA: Diagnosis not present

## 2022-06-04 DIAGNOSIS — O409XX0 Polyhydramnios, unspecified trimester, not applicable or unspecified: Secondary | ICD-10-CM

## 2022-06-04 DIAGNOSIS — O09523 Supervision of elderly multigravida, third trimester: Secondary | ICD-10-CM | POA: Insufficient documentation

## 2022-06-04 DIAGNOSIS — O09529 Supervision of elderly multigravida, unspecified trimester: Secondary | ICD-10-CM

## 2022-06-04 LAB — POCT URINALYSIS DIPSTICK OB
Bilirubin, UA: NEGATIVE
Blood, UA: NEGATIVE
Glucose, UA: NEGATIVE
Ketones, UA: NEGATIVE
Leukocytes, UA: NEGATIVE
Nitrite, UA: NEGATIVE
POC,PROTEIN,UA: NEGATIVE
Spec Grav, UA: 1.015 (ref 1.010–1.025)
Urobilinogen, UA: 0.2 E.U./dL
pH, UA: 7 (ref 5.0–8.0)

## 2022-06-04 NOTE — Progress Notes (Signed)
   PRENATAL VISIT NOTE  Subjective:  Kristen [name] is a 37 y.o. G6P5005 at 91w1dbeing seen today for ongoing prenatal care.  She is currently monitored for the following issues for this high-risk pregnancy and has Supervision of high risk pregnancy, antepartum; History of gestational diabetes in prior pregnancy, currently pregnant; Antepartum multigravida of advanced maternal age; Horseshoe kidney; Kidney stones; Umbilical hernia without obstruction and without gangrene; Gestational diabetes mellitus; and Polyhydramnios affecting pregnancy on their problem list.  Patient reports occasional contractions.  Contractions: Irritability. Vag. Bleeding: None.  Movement: Present. Denies leaking of fluid.   The following portions of the patient's history were reviewed and updated as appropriate: allergies, current medications, past family history, past medical history, past social history, past surgical history and problem list.   Objective:   Vitals:   06/04/22 0815  BP: 114/78  Pulse: 96  Weight: 160 lb (72.6 kg)    Fetal Status: Fetal Heart Rate (bpm): 149 Fundal Height: 38 cm Movement: Present     General:  Alert, oriented and cooperative. Patient is in no acute distress.  Skin: Skin is warm and dry. No rash noted.   Cardiovascular: Normal heart rate noted  Respiratory: Normal respiratory effort, no problems with respiration noted  Abdomen: Soft, gravid, appropriate for gestational age.  Pain/Pressure: Present     Pelvic: Cervical exam performed in the presence of a chaperone Dilation: 1 Effacement (%): 50 Station: -3  Extremities: Normal range of motion.  Edema: None  Mental Status: Normal mood and affect. Normal behavior. Normal judgment and thought content.   Assessment and Plan:  Pregnancy: G6P5005 at 325w1d. [redacted] weeks gestation of pregnancy - Culture, beta strep (group b only) - [facility]  2. Supervision of high  risk pregnancy, antepartum - POC Urinalysis Dipstick OB  3. Diet controlled gestational diabetes mellitus (GDM) in third trimester Good control. Induction at 39 weeks - POC Urinalysis Dipstick OB  4. Polyhydramnios affecting pregnancy AFI 25. Per MFM - idiopathic. Induction 39 weeks  5. Antepartum multigravida of advanced maternal age ASA 8121mPreterm labor symptoms and general obstetric precautions including but not limited to vaginal bleeding, contractions, leaking of fluid and fetal movement were reviewed in detail with the patient. Please refer to After Visit Summary for other counseling recommendations.   No follow-ups on file.  Future Appointments  Date Time Provider DepWaianae/29/2024  8:15 AM StiTruett MainlandO [facility] None    JacTruett MainlandO

## 2022-06-05 LAB — GC/CHLAMYDIA PROBE AMP (~~LOC~~) NOT AT ARMC
Chlamydia: NEGATIVE
Comment: NEGATIVE
Comment: NORMAL
Neisseria Gonorrhea: NEGATIVE

## 2022-06-07 LAB — CULTURE, BETA STREP (GROUP B ONLY): Strep Gp B Culture: POSITIVE — AB

## 2022-06-08 ENCOUNTER — Encounter: Payer: Self-pay | Admitting: Family Medicine

## 2022-06-08 DIAGNOSIS — O99820 Streptococcus B carrier state complicating pregnancy: Secondary | ICD-10-CM | POA: Insufficient documentation

## 2022-06-11 ENCOUNTER — Ambulatory Visit (INDEPENDENT_AMBULATORY_CARE_PROVIDER_SITE_OTHER): Payer: Medicaid Other | Admitting: Family Medicine

## 2022-06-11 VITALS — BP 115/78 | HR 83 | Wt 161.0 lb

## 2022-06-11 DIAGNOSIS — O409XX0 Polyhydramnios, unspecified trimester, not applicable or unspecified: Secondary | ICD-10-CM

## 2022-06-11 DIAGNOSIS — O24410 Gestational diabetes mellitus in pregnancy, diet controlled: Secondary | ICD-10-CM

## 2022-06-11 DIAGNOSIS — O0990 Supervision of high risk pregnancy, unspecified, unspecified trimester: Secondary | ICD-10-CM

## 2022-06-11 DIAGNOSIS — O99820 Streptococcus B carrier state complicating pregnancy: Secondary | ICD-10-CM

## 2022-06-11 DIAGNOSIS — O403XX1 Polyhydramnios, third trimester, fetus 1: Secondary | ICD-10-CM

## 2022-06-11 DIAGNOSIS — O09523 Supervision of elderly multigravida, third trimester: Secondary | ICD-10-CM

## 2022-06-11 DIAGNOSIS — O09529 Supervision of elderly multigravida, unspecified trimester: Secondary | ICD-10-CM

## 2022-06-11 DIAGNOSIS — Z3A37 37 weeks gestation of pregnancy: Secondary | ICD-10-CM

## 2022-06-11 NOTE — Progress Notes (Signed)
   PRENATAL VISIT NOTE  Subjective:  Kristen [name] is a 37 y.o. G6P5005 at 87w1dbeing seen today for ongoing prenatal care.  She is currently monitored for the following issues for this high-risk pregnancy and has Supervision of high risk pregnancy, antepartum; History of gestational diabetes in prior pregnancy, currently pregnant; Antepartum multigravida of advanced maternal age; Horseshoe kidney; Kidney stones; Umbilical hernia without obstruction and without gangrene; Gestational diabetes mellitus; Polyhydramnios affecting pregnancy; and GBS (group B Streptococcus carrier), +RV culture, currently pregnant on their problem list.  Patient reports occasional contractions.  Contractions: Irregular. Vag. Bleeding: None.  Movement: Present. Denies leaking of fluid.   The following portions of the patient's history were reviewed and updated as appropriate: allergies, current medications, past family history, past medical history, past social history, past surgical history and problem list.   Objective:   Vitals:   06/11/22 0810  BP: 115/78  Pulse: 83  Weight: 161 lb (73 kg)    Fetal Status:     Movement: Present     General:  Alert, oriented and cooperative. Patient is in no acute distress.  Skin: Skin is warm and dry. No rash noted.   Cardiovascular: Normal heart rate noted  Respiratory: Normal respiratory effort, no problems with respiration noted  Abdomen: Soft, gravid, appropriate for gestational age.  Pain/Pressure: Present     Pelvic: Cervical exam deferred        Extremities: Normal range of motion.  Edema: Trace  Mental Status: Normal mood and affect. Normal behavior. Normal judgment and thought content.   Assessment and Plan:  Pregnancy: G6P5005 at 333w1d. [redacted] weeks gestation of pregnancy  2. Supervision of high risk pregnancy, antepartum FHT and FH normal  3. Diet controlled gestational diabetes mellitus (GDM), antepartum Induction scheduled 39-40 weeks (on 3/19)  4.  Antepartum multigravida of advanced maternal age ASA 8173m5. Polyhydramnios affecting pregnancy Mild, idiopathic  6. GBS (group B Streptococcus carrier), +RV culture, currently pregnant Intrapartum ppx.   Term labor symptoms and general obstetric precautions including but not limited to vaginal bleeding, contractions, leaking of fluid and fetal movement were reviewed in detail with the patient. Please refer to After Visit Summary for other counseling recommendations.   No follow-ups on file.  No future appointments.  JacTruett MainlandO

## 2022-06-16 ENCOUNTER — Telehealth (HOSPITAL_COMMUNITY): Payer: Self-pay | Admitting: *Deleted

## 2022-06-16 ENCOUNTER — Encounter (HOSPITAL_COMMUNITY): Payer: Self-pay

## 2022-06-16 ENCOUNTER — Encounter: Payer: Self-pay | Admitting: Family Medicine

## 2022-06-16 NOTE — Telephone Encounter (Signed)
Preadmission screen  

## 2022-06-17 ENCOUNTER — Telehealth (HOSPITAL_COMMUNITY): Payer: Self-pay | Admitting: *Deleted

## 2022-06-17 ENCOUNTER — Other Ambulatory Visit: Payer: Self-pay | Admitting: Advanced Practice Midwife

## 2022-06-17 NOTE — Telephone Encounter (Signed)
Preadmission screen  

## 2022-06-18 ENCOUNTER — Ambulatory Visit (INDEPENDENT_AMBULATORY_CARE_PROVIDER_SITE_OTHER): Payer: Medicaid Other | Admitting: Family Medicine

## 2022-06-18 ENCOUNTER — Telehealth (HOSPITAL_COMMUNITY): Payer: Self-pay | Admitting: *Deleted

## 2022-06-18 VITALS — BP 130/80 | HR 90 | Wt 160.0 lb

## 2022-06-18 DIAGNOSIS — O0993 Supervision of high risk pregnancy, unspecified, third trimester: Secondary | ICD-10-CM

## 2022-06-18 DIAGNOSIS — O403XX0 Polyhydramnios, third trimester, not applicable or unspecified: Secondary | ICD-10-CM

## 2022-06-18 DIAGNOSIS — O09529 Supervision of elderly multigravida, unspecified trimester: Secondary | ICD-10-CM

## 2022-06-18 DIAGNOSIS — O99820 Streptococcus B carrier state complicating pregnancy: Secondary | ICD-10-CM

## 2022-06-18 DIAGNOSIS — O09523 Supervision of elderly multigravida, third trimester: Secondary | ICD-10-CM

## 2022-06-18 DIAGNOSIS — O409XX0 Polyhydramnios, unspecified trimester, not applicable or unspecified: Secondary | ICD-10-CM

## 2022-06-18 DIAGNOSIS — Z3A38 38 weeks gestation of pregnancy: Secondary | ICD-10-CM

## 2022-06-18 DIAGNOSIS — O24410 Gestational diabetes mellitus in pregnancy, diet controlled: Secondary | ICD-10-CM

## 2022-06-18 DIAGNOSIS — O0990 Supervision of high risk pregnancy, unspecified, unspecified trimester: Secondary | ICD-10-CM

## 2022-06-18 NOTE — Telephone Encounter (Signed)
Preadmission screen  

## 2022-06-18 NOTE — Progress Notes (Signed)
   PRENATAL VISIT NOTE  Subjective:  Kristen [name] is a 37 y.o. G6P5005 at 41w1dbeing seen today for ongoing prenatal care.  She is currently monitored for the following issues for this high-risk pregnancy and has Supervision of high risk pregnancy, antepartum; History of gestational diabetes in prior pregnancy, currently pregnant; Antepartum multigravida of advanced maternal age; Horseshoe kidney; Kidney stones; Umbilical hernia without obstruction and without gangrene; Gestational diabetes mellitus; Polyhydramnios affecting pregnancy; and GBS (group B Streptococcus carrier), +RV culture, currently pregnant on their problem list.  Patient reports no complaints.  Contractions: Not present. Vag. Bleeding: Bloody Show.  Movement: Present. Denies leaking of fluid.   The following portions of the patient's history were reviewed and updated as appropriate: allergies, current medications, past family history, past medical history, past social history, past surgical history and problem list.   Objective:   Vitals:   06/18/22 0837 06/18/22 0851  BP: 136/88 130/80  Pulse: (!) 104 90  Weight: 160 lb (72.6 kg)     Fetal Status: Fetal Heart Rate (bpm): 135   Movement: Present     General:  Alert, oriented and cooperative. Patient is in no acute distress.  Skin: Skin is warm and dry. No rash noted.   Cardiovascular: Normal heart rate noted  Respiratory: Normal respiratory effort, no problems with respiration noted  Abdomen: Soft, gravid, appropriate for gestational age.  Pain/Pressure: Absent     Pelvic: Cervical exam performed in the presence of a chaperone        Extremities: Normal range of motion.  Edema: None  Mental Status: Normal mood and affect. Normal behavior. Normal judgment and thought content.   Assessment and Plan:  Pregnancy: [id number] 355w1d. Supervision of high risk pregnancy, antepartum FHT and FH normal  2. Diet controlled gestational diabetes mellitus (GDM) in third  trimester controlled Induction 38-39 weeks due to DM and polyhydramnios  3. Polyhydramnios affecting pregnancy  4. GBS (group B Streptococcus carrier), +RV culture, currently pregnant Intrapartum ppx  5. Antepartum multigravida of advanced maternal age  Preterm labor symptoms and general obstetric precautions including but not limited to vaginal bleeding, contractions, leaking of fluid and fetal movement were reviewed in detail with the patient. Please refer to After Visit Summary for other counseling recommendations.   No follow-ups on file.  Future Appointments  Date Time Provider [place]/03/2023  6:45 AM [facility] SCHED ROOM [facility] None  07/23/2022 10:15 AM StTruett MainlandDO [facility] None    JaTruett MainlandDO

## 2022-06-19 ENCOUNTER — Inpatient Hospital Stay (HOSPITAL_COMMUNITY)
Admission: AD | Admit: 2022-06-19 | Discharge: 2022-06-19 | Disposition: A | Discharge status: Home / Self Care | Payer: Medicaid Other | Attending: Obstetrics and Gynecology | Admitting: Obstetrics and Gynecology

## 2022-06-19 ENCOUNTER — Telehealth (HOSPITAL_COMMUNITY): Payer: Self-pay | Admitting: *Deleted

## 2022-06-19 ENCOUNTER — Encounter (HOSPITAL_COMMUNITY): Payer: Self-pay | Admitting: Obstetrics and Gynecology

## 2022-06-19 ENCOUNTER — Other Ambulatory Visit: Payer: Self-pay

## 2022-06-19 ENCOUNTER — Encounter (HOSPITAL_COMMUNITY): Payer: Self-pay | Admitting: *Deleted

## 2022-06-19 DIAGNOSIS — O169 Unspecified maternal hypertension, unspecified trimester: Secondary | ICD-10-CM

## 2022-06-19 DIAGNOSIS — O163 Unspecified maternal hypertension, third trimester: Secondary | ICD-10-CM | POA: Diagnosis not present

## 2022-06-19 DIAGNOSIS — O0943 Supervision of pregnancy with grand multiparity, third trimester: Secondary | ICD-10-CM | POA: Diagnosis not present

## 2022-06-19 DIAGNOSIS — O09523 Supervision of elderly multigravida, third trimester: Secondary | ICD-10-CM | POA: Diagnosis not present

## 2022-06-19 DIAGNOSIS — R030 Elevated blood-pressure reading, without diagnosis of hypertension: Secondary | ICD-10-CM | POA: Insufficient documentation

## 2022-06-19 DIAGNOSIS — O26893 Other specified pregnancy related conditions, third trimester: Secondary | ICD-10-CM | POA: Insufficient documentation

## 2022-06-19 DIAGNOSIS — Z3A38 38 weeks gestation of pregnancy: Secondary | ICD-10-CM | POA: Diagnosis not present

## 2022-06-19 DIAGNOSIS — O368130 Decreased fetal movements, third trimester, not applicable or unspecified: Secondary | ICD-10-CM | POA: Insufficient documentation

## 2022-06-19 DIAGNOSIS — Z3493 Encounter for supervision of normal pregnancy, unspecified, third trimester: Secondary | ICD-10-CM

## 2022-06-19 LAB — PROTEIN / CREATININE RATIO, URINE
Creatinine, Urine: 28 mg/dL
Total Protein, Urine: <6 mg/dL

## 2022-06-19 LAB — COMPREHENSIVE METABOLIC PANEL
ALT: 17 U/L (ref 0–44)
AST: 20 U/L (ref 15–41)
Albumin: 2.7 g/dL — ABNORMAL LOW (ref 3.5–5.0)
Alkaline Phosphatase: 130 U/L — ABNORMAL HIGH (ref 38–126)
Anion gap: 8 (ref 5–15)
BUN: 9 mg/dL (ref 6–20)
CO2: 21 mmol/L — ABNORMAL LOW (ref 22–32)
Calcium: 9 mg/dL (ref 8.9–10.3)
Chloride: 107 mmol/L (ref 98–111)
Creatinine, Ser: 0.53 mg/dL (ref 0.44–1.00)
GFR, Estimated: >60 mL/min (ref >60)
Glucose, Bld: 86 mg/dL (ref 70–99)
Potassium: 3.8 mmol/L (ref 3.5–5.1)
Sodium: 136 mmol/L (ref 135–145)
Total Bilirubin: 0.3 mg/dL (ref 0.3–1.2)
Total Protein: 6.1 g/dL — ABNORMAL LOW (ref 6.5–8.1)

## 2022-06-19 LAB — URINALYSIS, ROUTINE W REFLEX MICROSCOPIC
Bilirubin Urine: NEGATIVE
Glucose, UA: NEGATIVE mg/dL
Ketones, ur: NEGATIVE mg/dL
Leukocytes,Ua: NEGATIVE
Nitrite: NEGATIVE
Protein, ur: NEGATIVE mg/dL
Specific Gravity, Urine: 1.008 (ref 1.005–1.030)
pH: 7 (ref 5.0–8.0)

## 2022-06-19 LAB — CBC
HCT: 31.8 % — ABNORMAL LOW (ref 36.0–46.0)
Hemoglobin: 10.5 g/dL — ABNORMAL LOW (ref 12.0–15.0)
MCH: 27.7 pg (ref 26.0–34.0)
MCHC: 33 g/dL (ref 30.0–36.0)
MCV: 83.9 fL (ref 80.0–100.0)
Platelets: 258 10*3/uL (ref 150–400)
RBC: 3.79 MIL/uL — ABNORMAL LOW (ref 3.87–5.11)
RDW: 14.6 % (ref 11.5–15.5)
WBC: 9.4 10*3/uL (ref 4.0–10.5)
nRBC: 0 % (ref 0.0–0.2)

## 2022-06-19 NOTE — MAU Note (Addendum)
..  Kristen [name] is a 37 y.o. at [redacted]w[redacted]d here in MAU reporting: DFM since this morning around 7am. No bleeding or vaginal discharge   Onset of complaint: 06/19/22 Pain score: 0/10 Vitals:   06/19/22 1357  BP: (!) 129/90  Pulse: (!) 110  Resp: 18  Temp: 97.7 F (36.5 C)     FHT:150 Lab orders placed from triage: UA

## 2022-06-19 NOTE — Discharge Instructions (Signed)

## 2022-06-19 NOTE — MAU Provider Note (Signed)
History     CSN: [id number]  Arrival date and time: 06/19/22 1336   Event Date/Time   First Provider Initiated Contact with Patient 06/19/22 1415      Chief Complaint  Patient presents with   Decreased Fetal Movement   HPI  Etienne Joss is a 37 y.o. G6P5005 at 41w2dwho presents for evaluation of decreased fetal movement. Patient reports she hasn't felt the baby move as much today. Since her arrival to MAU, she is feeling normal fetal movement. She states she had her membranes swept in the office yesterday and was concerned that caused her baby not to move normally.  She denies any pain. She denies any vaginal bleeding, discharge, and leaking of fluid. Denies any constipation, diarrhea or any urinary complaints.   Of note, patient had elevated BP x1 upon arrival to MAU. She denies any HA, visual changes or epigastric pain.   OB History     Gravida  6   Para  5   Term  5   Preterm      AB      Living  5      SAB      IAB      Ectopic      Multiple      Live Births  5           Past Medical History:  Diagnosis Date   Gestational diabetes     Past Surgical History:  Procedure Laterality Date   HERNIA REPAIR      Family History  Problem Relation Age of Onset   Diabetes Mother    Diabetes Father     Social History   Tobacco Use   Smoking status: Every Day    Packs/day: 0.50    Types: Cigarettes  Vaping Use   Vaping Use: Never used  Substance Use Topics   Alcohol use: No   Drug use: No    Allergies:  Allergies  Allergen Reactions   Bactrim [Sulfamethoxazole-Trimethoprim] Nausea Only    No medications prior to admission.    Review of Systems  Constitutional: Negative.  Negative for fatigue and fever.  HENT: Negative.    Respiratory: Negative.  Negative for shortness of breath.   Cardiovascular: Negative.  Negative for chest pain.  Gastrointestinal: Negative.  Negative for abdominal pain, constipation, diarrhea, nausea and  vomiting.  Genitourinary: Negative.  Negative for dysuria, vaginal bleeding and vaginal discharge.  Neurological: Negative.  Negative for dizziness and headaches.   Physical Exam   Blood pressure 125/72, pulse 93, temperature 97.7 F (36.5 C), temperature source Oral, resp. rate 18, last menstrual period 09/24/2021, SpO2 98 %.  Patient Vitals for the past 24 hrs:  BP Temp Temp src Pulse Resp SpO2  06/19/22 1616 125/72 -- -- 93 -- --  06/19/22 1601 134/82 -- -- (!) 104 -- --  06/19/22 1546 118/67 -- -- 95 -- --  06/19/22 1536 120/74 -- -- 89 -- --  06/19/22 1455 -- -- -- -- -- 98 %  06/19/22 1450 -- -- -- -- -- 98 %  06/19/22 1446 117/74 -- -- 95 -- 98 %  06/19/22 1431 115/73 -- -- 96 -- --  06/19/22 1414 122/82 -- -- 92 -- --  06/19/22 1410 -- -- -- -- -- 98 %  06/19/22 1405 -- -- -- -- -- 99 %  06/19/22 1400 -- -- -- -- -- 98 %  06/19/22 1357 (!) 129/90 97.7 F (36.5 C) Oral (!) 110 18 --  06/19/22 1354 (!) 129/90 -- -- (!) 102 -- --    Physical Exam Vitals and nursing note reviewed.  Constitutional:      General: She is not in acute distress.    Appearance: She is well-developed.  HENT:     Head: Normocephalic.  Eyes:     Pupils: Pupils are equal, round, and reactive to light.  Cardiovascular:     Rate and Rhythm: Normal rate and regular rhythm.     Heart sounds: Normal heart sounds.  Pulmonary:     Effort: Pulmonary effort is normal. No respiratory distress.     Breath sounds: Normal breath sounds.  Abdominal:     General: Bowel sounds are normal. There is no distension.     Palpations: Abdomen is soft.     Tenderness: There is no abdominal tenderness.  Skin:    General: Skin is warm and dry.  Neurological:     Mental Status: She is alert and oriented to person, place, and time.  Psychiatric:        Mood and Affect: Mood normal.        Behavior: Behavior normal.        Thought Content: Thought content normal.        Judgment: Judgment normal.     Fetal  Tracing:  Baseline: 125 Variability: moderate Accels: 15x15 Decels: none  Toco: occasional uc's  MAU Course  Procedures  Results for orders placed or performed during the hospital encounter of 06/19/22 (from the past 24 hour(s))  Protein / creatinine ratio, urine     Status: None   Collection Time: 06/19/22  2:37 PM  Result Value Ref Range   Creatinine, Urine 28 mg/dL   Total Protein, Urine <6 mg/dL   Protein Creatinine Ratio        0.00 - 0.15 mg/mg[Cre]  Urinalysis, Routine w reflex microscopic -Urine, Clean Catch     Status: Abnormal   Collection Time: 06/19/22  2:37 PM  Result Value Ref Range   Color, Urine STRAW (A) YELLOW   APPearance CLEAR CLEAR   Specific Gravity, Urine 1.008 1.005 - 1.030   pH 7.0 5.0 - 8.0   Glucose, UA NEGATIVE NEGATIVE mg/dL   Hgb urine dipstick SMALL (A) NEGATIVE   Bilirubin Urine NEGATIVE NEGATIVE   Ketones, ur NEGATIVE NEGATIVE mg/dL   Protein, ur NEGATIVE NEGATIVE mg/dL   Nitrite NEGATIVE NEGATIVE   Leukocytes,Ua NEGATIVE NEGATIVE   RBC / HPF 0-5 0 - 5 RBC/hpf   WBC, UA 0-5 0 - 5 WBC/hpf   Bacteria, UA RARE (A) NONE SEEN   Squamous Epithelial / HPF 0-5 0 - 5 /HPF   Mucus PRESENT   CBC     Status: Abnormal   Collection Time: 06/19/22  2:55 PM  Result Value Ref Range   WBC 9.4 4.0 - 10.5 K/uL   RBC 3.79 (L) 3.87 - 5.11 MIL/uL   Hemoglobin 10.5 (L) 12.0 - 15.0 g/dL   HCT 31.8 (L) 36.0 - 46.0 %   MCV 83.9 80.0 - 100.0 fL   MCH 27.7 26.0 - 34.0 pg   MCHC 33.0 30.0 - 36.0 g/dL   RDW 14.6 11.5 - 15.5 %   Platelets 258 150 - 400 K/uL   nRBC 0.0 0.0 - 0.2 %  Comprehensive metabolic panel     Status: Abnormal   Collection Time: 06/19/22  2:55 PM  Result Value Ref Range   Sodium 136 135 - 145 mmol/L   Potassium 3.8 3.5 -  5.1 mmol/L   Chloride 107 98 - 111 mmol/L   CO2 21 (L) 22 - 32 mmol/L   Glucose, Bld 86 70 - 99 mg/dL   BUN 9 6 - 20 mg/dL   Creatinine, Ser 0.53 0.44 - 1.00 mg/dL   Calcium 9.0 8.9 - 10.3 mg/dL   Total Protein 6.1  (L) 6.5 - 8.1 g/dL   Albumin 2.7 (L) 3.5 - 5.0 g/dL   AST 20 15 - 41 U/L   ALT 17 0 - 44 U/L   Alkaline Phosphatase 130 (H) 38 - 126 U/L   Total Bilirubin 0.3 0.3 - 1.2 mg/dL   GFR, Estimated >60 >60 mL/min   Anion gap 8 5 - 15     MDM Labs ordered and reviewed.   NST reactive and normal movement present CBC, CMP, Protein/creat ratio UA  BPs normal after initial. Patient agreeable to return for BP check tomorrow. If elevated, will admit for IOL for gHTN. If normal, patient will return for scheduled IOL next week.   Assessment and Plan   1. Movement of fetus present during pregnancy in third trimester   2. [redacted] weeks gestation of pregnancy   3. Elevated blood pressure affecting pregnancy, antepartum     -Discharge home in stable condition -Hypertension precautions discussed -Patient advised to follow-up with MAU tomorrow for BP check -Patient may return to MAU as needed or if her condition were to change or worsen  Wende Mott, CNM 06/19/2022, 2:15 PM

## 2022-06-19 NOTE — Telephone Encounter (Signed)
Preadmission screen  

## 2022-06-20 ENCOUNTER — Other Ambulatory Visit (HOSPITAL_COMMUNITY): Payer: Medicaid Other | Attending: Obstetrics and Gynecology

## 2022-06-20 ENCOUNTER — Inpatient Hospital Stay (HOSPITAL_COMMUNITY)
Admission: AD | Admit: 2022-06-20 | Discharge: 2022-06-20 | Disposition: A | Discharge status: Home / Self Care | Payer: Medicaid Other | Attending: Maternal & Fetal Medicine | Admitting: Maternal & Fetal Medicine

## 2022-06-20 ENCOUNTER — Encounter (HOSPITAL_COMMUNITY): Payer: Self-pay | Admitting: Obstetrics and Gynecology

## 2022-06-20 DIAGNOSIS — O471 False labor at or after 37 completed weeks of gestation: Secondary | ICD-10-CM | POA: Insufficient documentation

## 2022-06-20 DIAGNOSIS — O26893 Other specified pregnancy related conditions, third trimester: Secondary | ICD-10-CM | POA: Insufficient documentation

## 2022-06-20 DIAGNOSIS — Z3A38 38 weeks gestation of pregnancy: Secondary | ICD-10-CM | POA: Diagnosis not present

## 2022-06-20 DIAGNOSIS — Z3689 Encounter for other specified antenatal screening: Secondary | ICD-10-CM

## 2022-06-20 DIAGNOSIS — O09523 Supervision of elderly multigravida, third trimester: Secondary | ICD-10-CM | POA: Diagnosis not present

## 2022-06-20 DIAGNOSIS — R519 Headache, unspecified: Secondary | ICD-10-CM | POA: Diagnosis not present

## 2022-06-20 DIAGNOSIS — O479 False labor, unspecified: Secondary | ICD-10-CM

## 2022-06-20 LAB — CBC
HCT: 32.4 % — ABNORMAL LOW (ref 36.0–46.0)
Hemoglobin: 10.8 g/dL — ABNORMAL LOW (ref 12.0–15.0)
MCH: 27.9 pg (ref 26.0–34.0)
MCHC: 33.3 g/dL (ref 30.0–36.0)
MCV: 83.7 fL (ref 80.0–100.0)
Platelets: 256 10*3/uL (ref 150–400)
RBC: 3.87 MIL/uL (ref 3.87–5.11)
RDW: 14.6 % (ref 11.5–15.5)
WBC: 10.5 10*3/uL (ref 4.0–10.5)
nRBC: 0 % (ref 0.0–0.2)

## 2022-06-20 LAB — COMPREHENSIVE METABOLIC PANEL
ALT: 17 U/L (ref 0–44)
AST: 22 U/L (ref 15–41)
Albumin: 2.8 g/dL — ABNORMAL LOW (ref 3.5–5.0)
Alkaline Phosphatase: 120 U/L (ref 38–126)
Anion gap: 8 (ref 5–15)
BUN: 10 mg/dL (ref 6–20)
CO2: 24 mmol/L (ref 22–32)
Calcium: 9.1 mg/dL (ref 8.9–10.3)
Chloride: 104 mmol/L (ref 98–111)
Creatinine, Ser: 0.59 mg/dL (ref 0.44–1.00)
GFR, Estimated: >60 mL/min (ref >60)
Glucose, Bld: 87 mg/dL (ref 70–99)
Potassium: 3.7 mmol/L (ref 3.5–5.1)
Sodium: 136 mmol/L (ref 135–145)
Total Bilirubin: 0.3 mg/dL (ref 0.3–1.2)
Total Protein: 5.9 g/dL — ABNORMAL LOW (ref 6.5–8.1)

## 2022-06-20 LAB — PROTEIN / CREATININE RATIO, URINE
Creatinine, Urine: 36 mg/dL
Total Protein, Urine: <6 mg/dL

## 2022-06-20 MED ORDER — ACETAMINOPHEN 500 MG PO TABS
1000.0000 mg | ORAL_TABLET | Freq: Once | ORAL | Status: AC
  Administered 2022-06-20: 1000 mg via ORAL
  Filled 2022-06-20: qty 2

## 2022-06-20 MED ORDER — CYCLOBENZAPRINE HCL 10 MG PO TABS: 10.0000 mg | ORAL_TABLET | Freq: Two times a day (BID) | ORAL | 0 refills | Status: DC | PRN

## 2022-06-20 MED ORDER — CYCLOBENZAPRINE HCL 5 MG PO TABS
10.0000 mg | ORAL_TABLET | Freq: Once | ORAL | Status: AC
  Administered 2022-06-20: 10 mg via ORAL
  Filled 2022-06-20: qty 2

## 2022-06-20 NOTE — MAU Provider Note (Signed)
History     CSN: [id number]  Arrival date and time: 06/20/22 1417   None     Chief Complaint  Patient presents with   Blood Pressure Check   HPI Kristen [name] is a 37 y.o. G6P5005 at 43w3dwho presents to MAU for BP check and headache. Patient was seen in MAU yesterday and initial BP was found to be elevated with subsequent normal BP's. She was told to return for BP check today. She reports headache that started last night that she is currently rating a 6-7/10. She has not tried anything to relieve the headache. She denies a history of migraines. No vision changes, significant swelling, or RUQ/epigastric pain. She reports painful contractions every few minutes apart. No leaking fluid or vaginal bleeding. She endorses normal fetal movement.   [facility] and has IOL scheduled at 39 weeks.    OB History     Gravida  6   Para  5   Term  5   Preterm      AB      Living  5      SAB      IAB      Ectopic      Multiple      Live Births  5           Past Medical History:  Diagnosis Date   Gestational diabetes     Past Surgical History:  Procedure Laterality Date   HERNIA REPAIR      Family History  Problem Relation Age of Onset   Diabetes Mother    Diabetes Father     Social History   Tobacco Use   Smoking status: Every Day    Packs/day: 0.50    Types: Cigarettes  Vaping Use   Vaping Use: Never used  Substance Use Topics   Alcohol use: No   Drug use: No    Allergies:  Allergies  Allergen Reactions   Bactrim [Sulfamethoxazole-Trimethoprim] Nausea Only    Medications Prior to Admission  Medication Sig Dispense Refill Last Dose   Accu-Chek Softclix Lancets lancets 1 each by Other route 4 (four) times daily. Use as instructed 100 each 12 06/20/2022   Blood Glucose Monitoring Suppl (ACCU-CHEK GUIDE) w/Device KIT 1 Device by Does not apply route 4 (four) times daily. 1 kit 0 06/20/2022   Ferrous Sulfate (IRON PO) Take 1 tablet  by mouth daily.   [id number]  FOLIC ACID PO Take by mouth.   06/20/2022   glucose blood (ACCU-CHEK GUIDE) test strip Use as instructed for testing 4 times a day. 100 each 12 06/20/2022   Review of Systems  Gastrointestinal:  Positive for abdominal pain (contractions).  Neurological:  Positive for headaches.   Physical Exam   Patient Vitals for the past 24 hrs:  BP Temp Pulse Resp SpO2 Height Weight  06/20/22 1806 -- -- -- 18 -- -- --  06/20/22 1630 126/82 -- 84 -- -- -- --  06/20/22 1515 128/89 -- 93 -- -- -- --  06/20/22 1500 122/82 -- 90 -- -- -- --  06/20/22 1458 125/87 -- 96 -- -- -- --  06/20/22 1438 128/81 98.3 F (36.8 C) 97 17 99 % '5\' 1"'$  (1.549 m) 73.5 kg   Physical Exam Vitals and nursing note reviewed.  Constitutional:      General: She is not in acute distress. Eyes:     Extraocular Movements: Extraocular movements intact.     Pupils: Pupils are  equal, round, and reactive to light.  Cardiovascular:     Rate and Rhythm: Normal rate.  Pulmonary:     Effort: Pulmonary effort is normal. No respiratory distress.  Abdominal:     Palpations: Abdomen is soft.     Tenderness: There is no abdominal tenderness.     Comments: Gravid   Musculoskeletal:        General: Normal range of motion.     Cervical back: Normal range of motion.  Skin:    General: Skin is warm and dry.  Neurological:     General: No focal deficit present.     Mental Status: She is alert and oriented to person, place, and time.  Psychiatric:        Mood and Affect: Mood normal.        Behavior: Behavior normal.   Dilation: 2 Effacement (%): 60 Cervical Position: Posterior Station: -3 Presentation: Vertex Exam by:: Tomasa Hose, RN  NST FHR: 130 bpm moderate variability, +15x15 accels, no decels Toco: Q 5-7 minutes  Results for orders placed or performed during the hospital encounter of 06/20/22 (from the past 24 hour(s))  CBC     Status: Abnormal   Collection Time: 06/20/22  3:25 PM  Result  Value Ref Range   WBC 10.5 4.0 - 10.5 K/uL   RBC 3.87 3.87 - 5.11 MIL/uL   Hemoglobin 10.8 (L) 12.0 - 15.0 g/dL   HCT 32.4 (L) 36.0 - 46.0 %   MCV 83.7 80.0 - 100.0 fL   MCH 27.9 26.0 - 34.0 pg   MCHC 33.3 30.0 - 36.0 g/dL   RDW 14.6 11.5 - 15.5 %   Platelets 256 150 - 400 K/uL   nRBC 0.0 0.0 - 0.2 %  Comprehensive metabolic panel     Status: Abnormal   Collection Time: 06/20/22  3:25 PM  Result Value Ref Range   Sodium 136 135 - 145 mmol/L   Potassium 3.7 3.5 - 5.1 mmol/L   Chloride 104 98 - 111 mmol/L   CO2 24 22 - 32 mmol/L   Glucose, Bld 87 70 - 99 mg/dL   BUN 10 6 - 20 mg/dL   Creatinine, Ser 0.59 0.44 - 1.00 mg/dL   Calcium 9.1 8.9 - 10.3 mg/dL   Total Protein 5.9 (L) 6.5 - 8.1 g/dL   Albumin 2.8 (L) 3.5 - 5.0 g/dL   AST 22 15 - 41 U/L   ALT 17 0 - 44 U/L   Alkaline Phosphatase 120 38 - 126 U/L   Total Bilirubin 0.3 0.3 - 1.2 mg/dL   GFR, Estimated >60 >60 mL/min   Anion gap 8 5 - 15  Protein / creatinine ratio, urine     Status: None   Collection Time: 06/20/22  3:43 PM  Result Value Ref Range   Creatinine, Urine 36 mg/dL   Total Protein, Urine <6 mg/dL   Protein Creatinine Ratio        0.00 - 0.15 mg/mg[Cre]    MAU Course  Procedures  MDM Serial BPs CBC, CMP, UPCR Tylenol/Flexeril NST  BP's normotensive. PEC labs normal. Patient given Tylenol/Flexeril for headache with some improvement in headache. She was offered a different medication to see if headache could be improved further but declines as she would have to stay here in MAU and just wants to go home. Will send Flexeril to patient's pharmacy. NST reactive and reassuring with very active fetus. Patient's cervix unchanged from exam in office however reporting more painful contractions.  Recheck after 2 hours remains unchanged.   Assessment and Plan   1. [redacted] weeks gestation of pregnancy   2. Headache in pregnancy, antepartum, third trimester   3. NST (non-stress test) reactive   4. Uterine contractions     - Discharge home in stable condition - Rx for Flexeril - Strict return precautions. Return to MAU as needed for new/worsening symptoms - Keep IOL as scheduled on 3/12  Renee Harder, CNM 06/20/2022, 6:27 PM

## 2022-06-20 NOTE — MAU Note (Signed)
Kristen [name] is a 37 y.o. at 15w3dhere in MAU reporting: was here yesterday, for decreased FM.  Had a couple high BP's,  was told to come back to get it checked. +HA (did not take anything), denies visual changes, epigastric pain or LOF. Denies bleeding or LOF, reports +FM today.  Is contracting, irregular Onset of complaint: yesterday Pain score: mild, HA 6 Vitals:   06/20/22 1438  BP: 128/81  Pulse: 97  Resp: 17  Temp: 98.3 F (36.8 C)  SpO2: 99%     FHT:164 Lab orders placed from triage:

## 2022-06-23 ENCOUNTER — Inpatient Hospital Stay (HOSPITAL_COMMUNITY): Payer: Medicaid Other

## 2022-06-23 ENCOUNTER — Encounter: Payer: Self-pay | Admitting: Women's Health

## 2022-06-23 ENCOUNTER — Other Ambulatory Visit: Payer: Self-pay | Admitting: Family Medicine

## 2022-06-24 ENCOUNTER — Other Ambulatory Visit: Payer: Self-pay

## 2022-06-24 ENCOUNTER — Inpatient Hospital Stay (HOSPITAL_COMMUNITY): Payer: Medicaid Other

## 2022-06-24 ENCOUNTER — Inpatient Hospital Stay (HOSPITAL_COMMUNITY)
Admission: AD | Admit: 2022-06-24 | Discharge: 2022-06-25 | DRG: 807 | Disposition: A | Discharge status: Home / Self Care | Payer: Medicaid Other | Attending: Family Medicine | Admitting: Family Medicine

## 2022-06-24 ENCOUNTER — Encounter (HOSPITAL_COMMUNITY): Payer: Self-pay | Admitting: Family Medicine

## 2022-06-24 ENCOUNTER — Inpatient Hospital Stay (HOSPITAL_COMMUNITY): Payer: Medicaid Other | Admitting: Anesthesiology

## 2022-06-24 DIAGNOSIS — O0990 Supervision of high risk pregnancy, unspecified, unspecified trimester: Secondary | ICD-10-CM

## 2022-06-24 DIAGNOSIS — Z3A39 39 weeks gestation of pregnancy: Secondary | ICD-10-CM

## 2022-06-24 DIAGNOSIS — O09523 Supervision of elderly multigravida, third trimester: Secondary | ICD-10-CM

## 2022-06-24 DIAGNOSIS — O99334 Smoking (tobacco) complicating childbirth: Secondary | ICD-10-CM | POA: Diagnosis present

## 2022-06-24 DIAGNOSIS — Z3490 Encounter for supervision of normal pregnancy, unspecified, unspecified trimester: Principal | ICD-10-CM | POA: Diagnosis present

## 2022-06-24 DIAGNOSIS — O24419 Gestational diabetes mellitus in pregnancy, unspecified control: Secondary | ICD-10-CM | POA: Diagnosis present

## 2022-06-24 DIAGNOSIS — O24410 Gestational diabetes mellitus in pregnancy, diet controlled: Secondary | ICD-10-CM

## 2022-06-24 DIAGNOSIS — O403XX0 Polyhydramnios, third trimester, not applicable or unspecified: Secondary | ICD-10-CM | POA: Diagnosis present

## 2022-06-24 DIAGNOSIS — O24420 Gestational diabetes mellitus in childbirth, diet controlled: Secondary | ICD-10-CM | POA: Diagnosis present

## 2022-06-24 DIAGNOSIS — O99820 Streptococcus B carrier state complicating pregnancy: Secondary | ICD-10-CM

## 2022-06-24 DIAGNOSIS — F17210 Nicotine dependence, cigarettes, uncomplicated: Secondary | ICD-10-CM | POA: Diagnosis present

## 2022-06-24 DIAGNOSIS — O99824 Streptococcus B carrier state complicating childbirth: Secondary | ICD-10-CM | POA: Diagnosis present

## 2022-06-24 DIAGNOSIS — O09529 Supervision of elderly multigravida, unspecified trimester: Secondary | ICD-10-CM

## 2022-06-24 DIAGNOSIS — O409XX0 Polyhydramnios, unspecified trimester, not applicable or unspecified: Secondary | ICD-10-CM | POA: Diagnosis present

## 2022-06-24 LAB — GLUCOSE, CAPILLARY
Glucose-Capillary: 78 mg/dL (ref 70–99)
Glucose-Capillary: 101 mg/dL — ABNORMAL HIGH (ref 70–99)
Glucose-Capillary: 82 mg/dL (ref 70–99)

## 2022-06-24 LAB — CBC
HCT: 31.8 % — ABNORMAL LOW (ref 36.0–46.0)
Hemoglobin: 10.7 g/dL — ABNORMAL LOW (ref 12.0–15.0)
MCH: 28 pg (ref 26.0–34.0)
MCHC: 33.6 g/dL (ref 30.0–36.0)
MCV: 83.2 fL (ref 80.0–100.0)
Platelets: 276 10*3/uL (ref 150–400)
RBC: 3.82 MIL/uL — ABNORMAL LOW (ref 3.87–5.11)
RDW: 14.6 % (ref 11.5–15.5)
WBC: 10.7 10*3/uL — ABNORMAL HIGH (ref 4.0–10.5)
nRBC: 0 % (ref 0.0–0.2)

## 2022-06-24 LAB — TYPE AND SCREEN
ABO/RH(D): A POS
Antibody Screen: NEGATIVE

## 2022-06-24 LAB — RPR: RPR Ser Ql: NONREACTIVE

## 2022-06-24 MED ORDER — IBUPROFEN 600 MG PO TABS
600.0000 mg | ORAL_TABLET | Freq: Four times a day (QID) | ORAL | Status: DC
  Administered 2022-06-24 – 2022-06-25 (×4): 600 mg via ORAL
  Filled 2022-06-24 (×4): qty 1

## 2022-06-24 MED ORDER — PRENATAL MULTIVITAMIN CH
1.0000 | ORAL_TABLET | Freq: Every day | ORAL | Status: DC
  Administered 2022-06-25: 1 via ORAL
  Filled 2022-06-24: qty 1

## 2022-06-24 MED ORDER — LIDOCAINE HCL (PF) 1 % IJ SOLN: 30.0000 mL | INTRAMUSCULAR | Status: DC | PRN

## 2022-06-24 MED ORDER — TETANUS-DIPHTH-ACELL PERTUSSIS 5-2.5-18.5 LF-MCG/0.5 IM SUSY: 0.5000 mL | PREFILLED_SYRINGE | Freq: Once | INTRAMUSCULAR | Status: DC

## 2022-06-24 MED ORDER — DIPHENHYDRAMINE HCL 25 MG PO CAPS: 25.0000 mg | ORAL_CAPSULE | Freq: Four times a day (QID) | ORAL | Status: DC | PRN

## 2022-06-24 MED ORDER — EPHEDRINE 5 MG/ML INJ: 10.0000 mg | INTRAVENOUS | Status: DC | PRN

## 2022-06-24 MED ORDER — SIMETHICONE 80 MG PO CHEW: 80.0000 mg | CHEWABLE_TABLET | ORAL | Status: DC | PRN

## 2022-06-24 MED ORDER — WITCH HAZEL-GLYCERIN EX PADS: 1.0000 | MEDICATED_PAD | CUTANEOUS | Status: DC | PRN

## 2022-06-24 MED ORDER — ACETAMINOPHEN 325 MG PO TABS: 650.0000 mg | ORAL_TABLET | ORAL | Status: DC | PRN

## 2022-06-24 MED ORDER — FENTANYL-BUPIVACAINE-NACL 0.5-0.125-0.9 MG/250ML-% EP SOLN: 12.0000 mL/h | EPIDURAL | Status: DC | PRN

## 2022-06-24 MED ORDER — ZOLPIDEM TARTRATE 5 MG PO TABS: 5.0000 mg | ORAL_TABLET | Freq: Every evening | ORAL | Status: DC | PRN

## 2022-06-24 MED ORDER — COCONUT OIL OIL: 1.0000 | TOPICAL_OIL | Status: DC | PRN

## 2022-06-24 MED ORDER — PENICILLIN G POT IN DEXTROSE 60000 UNIT/ML IV SOLN
3.0000 10*6.[IU] | INTRAVENOUS | Status: DC
  Administered 2022-06-24 (×2): 3 10*6.[IU] via INTRAVENOUS
  Filled 2022-06-24 (×6): qty 50

## 2022-06-24 MED ORDER — TERBUTALINE SULFATE 1 MG/ML IJ SOLN: 0.2500 mg | Freq: Once | INTRAMUSCULAR | Status: DC | PRN

## 2022-06-24 MED ORDER — LIDOCAINE HCL (PF) 1 % IJ SOLN
INTRAMUSCULAR | Status: DC | PRN
  Administered 2022-06-24 (×2): 4 mL via EPIDURAL

## 2022-06-24 MED ORDER — OXYTOCIN-SODIUM CHLORIDE 30-0.9 UT/500ML-% IV SOLN
1.0000 m[IU]/min | INTRAVENOUS | Status: DC
  Administered 2022-06-24: 2 m[IU]/min via INTRAVENOUS

## 2022-06-24 MED ORDER — ONDANSETRON HCL 4 MG/2ML IJ SOLN: 4.0000 mg | Freq: Four times a day (QID) | INTRAMUSCULAR | Status: DC | PRN

## 2022-06-24 MED ORDER — FENTANYL CITRATE (PF) 100 MCG/2ML IJ SOLN: 100.0000 ug | INTRAMUSCULAR | Status: DC | PRN

## 2022-06-24 MED ORDER — ONDANSETRON HCL 4 MG/2ML IJ SOLN: 4.0000 mg | INTRAMUSCULAR | Status: DC | PRN

## 2022-06-24 MED ORDER — OXYTOCIN-SODIUM CHLORIDE 30-0.9 UT/500ML-% IV SOLN: 1.0000 m[IU]/min | INTRAVENOUS | Status: DC

## 2022-06-24 MED ORDER — SOD CITRATE-CITRIC ACID 500-334 MG/5ML PO SOLN
30.0000 mL | ORAL | Status: DC | PRN
  Administered 2022-06-24: 30 mL via ORAL
  Filled 2022-06-24: qty 30

## 2022-06-24 MED ORDER — LACTATED RINGERS IV SOLN: INTRAVENOUS | Status: DC

## 2022-06-24 MED ORDER — OXYCODONE HCL 5 MG PO TABS: 10.0000 mg | ORAL_TABLET | ORAL | Status: DC | PRN

## 2022-06-24 MED ORDER — PHENYLEPHRINE 80 MCG/ML (10ML) SYRINGE FOR IV PUSH (FOR BLOOD PRESSURE SUPPORT): 80.0000 ug | PREFILLED_SYRINGE | INTRAVENOUS | Status: DC | PRN

## 2022-06-24 MED ORDER — DIPHENHYDRAMINE HCL 50 MG/ML IJ SOLN: 12.5000 mg | INTRAMUSCULAR | Status: DC | PRN

## 2022-06-24 MED ORDER — LACTATED RINGERS IV SOLN: 500.0000 mL | Freq: Once | INTRAVENOUS | Status: DC

## 2022-06-24 MED ORDER — OXYCODONE-ACETAMINOPHEN 5-325 MG PO TABS: 2.0000 | ORAL_TABLET | ORAL | Status: DC | PRN

## 2022-06-24 MED ORDER — BENZOCAINE-MENTHOL 20-0.5 % EX AERO: 1.0000 | INHALATION_SPRAY | CUTANEOUS | Status: DC | PRN

## 2022-06-24 MED ORDER — OXYCODONE HCL 5 MG PO TABS: 5.0000 mg | ORAL_TABLET | ORAL | Status: DC | PRN

## 2022-06-24 MED ORDER — FENTANYL-BUPIVACAINE-NACL 0.5-0.125-0.9 MG/250ML-% EP SOLN
12.0000 mL/h | EPIDURAL | Status: DC | PRN
  Administered 2022-06-24: 12 mL/h via EPIDURAL
  Filled 2022-06-24: qty 250

## 2022-06-24 MED ORDER — LACTATED RINGERS IV SOLN: 500.0000 mL | INTRAVENOUS | Status: DC | PRN

## 2022-06-24 MED ORDER — SODIUM CHLORIDE 0.9 % IV SOLN
5.0000 10*6.[IU] | Freq: Once | INTRAVENOUS | Status: AC
  Administered 2022-06-24: 5 10*6.[IU] via INTRAVENOUS
  Filled 2022-06-24: qty 5

## 2022-06-24 MED ORDER — OXYTOCIN-SODIUM CHLORIDE 30-0.9 UT/500ML-% IV SOLN
2.5000 [IU]/h | INTRAVENOUS | Status: DC
  Filled 2022-06-24: qty 500

## 2022-06-24 MED ORDER — OXYCODONE-ACETAMINOPHEN 5-325 MG PO TABS: 1.0000 | ORAL_TABLET | ORAL | Status: DC | PRN

## 2022-06-24 MED ORDER — SENNOSIDES-DOCUSATE SODIUM 8.6-50 MG PO TABS
2.0000 | ORAL_TABLET | Freq: Every day | ORAL | Status: DC
  Administered 2022-06-25: 2 via ORAL
  Filled 2022-06-24: qty 2

## 2022-06-24 MED ORDER — ONDANSETRON HCL 4 MG PO TABS: 4.0000 mg | ORAL_TABLET | ORAL | Status: DC | PRN

## 2022-06-24 MED ORDER — OXYTOCIN BOLUS FROM INFUSION: 333.0000 mL | Freq: Once | INTRAVENOUS | Status: DC

## 2022-06-24 MED ORDER — DIBUCAINE (PERIANAL) 1 % EX OINT: 1.0000 | TOPICAL_OINTMENT | CUTANEOUS | Status: DC | PRN

## 2022-06-24 NOTE — Progress Notes (Signed)
Mikaili Kutzler is a 37 y.o. G6P5005 at 14w0dby LMP admitted for induction of labor due to Gestational diabetes diet controlled with mild polyhydramnios. .  Subjective: Patient resting well.   Objective: BP 124/82   Pulse 92   Temp (!) 97.5 F (36.4 C) (Oral)   Resp 18   Ht '5\' 1"'$  (1.549 m)   Wt 73.9 kg   LMP 09/24/2021 (Exact Date)   BMI 30.80 kg/m  No intake/output data recorded. No intake/output data recorded.  FHT:  FHR: 135 bpm, variability: moderate,  accelerations:  Present,  decelerations:  Absent UC:   regular, every 2-3 minutes SVE:   Dilation: 4 Effacement (%): 80 Station: -1 Exam by:: SGailen Shelter cnm  Labs: Lab Results  Component Value Date   WBC 10.7 (H) 06/24/2022   HGB 10.7 (L) 06/24/2022   HCT 31.8 (L) 06/24/2022   MCV 83.2 06/24/2022   PLT 276 06/24/2022   Procedure: CNM to patient bedside to discuss AROM with patient and FOB. Both risks and benefits reviewed. All questions answered at the bedside. Patient agrees to plan of care. FHT cat 1 prior to AROM.  Fetal head well applied to cervix. AROM completed with [place] and AROM performed without difficulty. Clear fluid noted. FHT remained cat I post AROM.    Assessment / Plan: Induction of labor due to gestational diabetes with mild polyhydramnios progressing well on pitocin.   Labor:     AROM performed without difficulty. Clear fluid. Continue to titrate Pit as needed.  Fetal Wellbeing:  Category I- continue to monitor  Pain Control:  Epidural I/D:   GBS <PCN Anticipated MOD:  NSVD  SJacquiline Doe CNM 06/24/2022, 12:31 PM

## 2022-06-24 NOTE — Progress Notes (Signed)
Patient ID: Kristen [name], female   DOB: 05-19-1985, 37 y.o.   MRN: [id number] Kristen [name] is a 37 y.o. G6P5005 at 15w0dadmitted for induction of labor due to diabetes mellitus A1/BDM, mild polyhydramnios  Subjective: beginning to get uncomfortable w/ contractions, rates 6/10  Objective: BP 116/80   Pulse 76   Temp 97.9 F (36.6 C) (Oral)   Resp 16   Ht '5\' 1"'$  (1.549 m)   Wt 73.9 kg   LMP 09/24/2021 (Exact Date)   BMI 30.80 kg/m  No intake/output data recorded.  FHR baseline 115 bpm, Variability: moderate, Accelerations:present, Decelerations:  Absent Toco: q 2-5 mins   SVE:   Dilation: 3 Effacement (%): 80 Station: Ballotable Exam by:: KKnute Neu CNM  Pitocin @ 6 mu/min  Labs: Lab Results  Component Value Date   WBC 10.7 (H) 06/24/2022   HGB 10.7 (L) 06/24/2022   HCT 31.8 (L) 06/24/2022   MCV 83.2 06/24/2022   PLT 276 06/24/2022    Assessment / Plan: IOL d/t diabetes mellitus A1/BDM and polyhydramnios, pit at 623mmin and becoming uncomfortable, but contractions have spaced out some. Continue increasing pit per protocol, AROM if needed once vtx lower. Pt to get up and walk around/use birthing ball, etc  Labor: early Fetal Wellbeing:  Category I Pain Control:  labor support without medications Pre-eclampsia: N/A I/D:  PCN for GBS+ Anticipated MOD: NSVB  KiRoma SchanzNM, WHNP-BC 06/24/2022, 6:18 AM

## 2022-06-24 NOTE — Anesthesia Postprocedure Evaluation (Signed)
Anesthesia Post Note  Patient: Trejure Bergamo  Procedure(s) Performed: AN AD HOC LABOR EPIDURAL     Patient location during evaluation: Mother Baby Anesthesia Type: Epidural Level of consciousness: awake and alert Pain management: pain level controlled Vital Signs Assessment: post-procedure vital signs reviewed and stable Respiratory status: spontaneous breathing, nonlabored ventilation and respiratory function stable Cardiovascular status: stable Postop Assessment: no headache, no backache and epidural receding Anesthetic complications: no   No notable events documented.  Last Vitals:  Vitals:   06/24/22 1636 06/24/22 2014  BP: 124/79 112/69  Pulse: 91 91  Resp: 16 16  Temp: 37.1 C 36.9 C  SpO2: 99% 98%    Last Pain:  Vitals:   06/24/22 2014  TempSrc: Oral  PainSc:    Pain Goal:                   Ailene Ards

## 2022-06-24 NOTE — Lactation Note (Signed)
This note was copied from a baby's chart. Lactation Consultation Note  Patient Name: Kristen [name] [id number] Date: 06/24/2022 Age:36 hours Reason for consult: Initial assessment;Term;Maternal endocrine disorder Experienced BF mom stated BF going well. Baby is BF well and awake looking around very content.  Newborn feeding habits, behavior, STS, I&O, supply and demand reviewed. Mom encouraged to feed baby 8-12 times/24 hours and with feeding cues.  Encouraged to call for assistance as needed, other wise Lactation won't visit mom unless called since mom is doing so well. Mom stated yes, she thought it is going really good.  Maternal Data Has patient been taught Hand Expression?: No Does the patient have breastfeeding experience prior to this delivery?: Yes How long did the patient breastfeed?: few weeks w/her now 60 yr old to 2 1/2 yrs w/her 67 month old. stopped last July  Feeding    LATCH Score                    Lactation Tools Discussed/Used    Interventions Interventions: Breast feeding basics reviewed;LC Services brochure  Discharge    Consult Status Consult Status: Complete    Nimrat Woolworth G 06/24/2022, 11:19 PM

## 2022-06-24 NOTE — Anesthesia Procedure Notes (Signed)
Epidural Patient location during procedure: OB Start time: 06/24/2022 7:25 AM End time: 06/24/2022 7:28 AM  Staffing Anesthesiologist: Brennan Bailey, MD Performed: anesthesiologist   Preanesthetic Checklist Completed: patient identified, IV checked, risks and benefits discussed, monitors and equipment checked, pre-op evaluation and timeout performed  Epidural Patient position: sitting Prep: DuraPrep and site prepped and draped Patient monitoring: continuous pulse ox, blood pressure and heart rate Approach: midline Location: L3-L4 Injection technique: LOR air  Needle:  Needle type: Tuohy  Needle gauge: 17 G Needle length: 9 cm Needle insertion depth: 5 cm Catheter type: closed end flexible Catheter size: 19 Gauge Catheter at skin depth: 10 cm Test dose: negative and Other (1% lidocaine)  Assessment Events: blood not aspirated, no cerebrospinal fluid, injection not painful, no injection resistance, no paresthesia and negative IV test  Additional Notes Patient identified. Risks, benefits, and alternatives discussed with patient including but not limited to bleeding, infection, nerve damage, paralysis, failed block, incomplete pain control, headache, blood pressure changes, nausea, vomiting, reactions to medication, itching, and postpartum back pain. Confirmed with bedside nurse the patient's most recent platelet count. Confirmed with patient that they are not currently taking any anticoagulation, have any bleeding history, or any family history of bleeding disorders. Patient expressed understanding and wished to proceed. All questions were answered. Sterile technique was used throughout the entire procedure. Please see nursing notes for vital signs.   Crisp LOR on first pass. Test dose was given through epidural catheter and negative prior to continuing to dose epidural or start infusion. Warning signs of high block given to the patient including shortness of breath,  tingling/numbness in hands, complete motor block, or any concerning symptoms with instructions to call for help. Patient was given instructions on fall risk and not to get out of bed. All questions and concerns addressed with instructions to call with any issues or inadequate analgesia.  Reason for block:procedure for pain

## 2022-06-24 NOTE — H&P (Signed)
Kristen [name] is a 37 y.o. G83P5005 female at 52w0dby LMP presenting for IOL d/t A1DM and mild polyhydramnios 26cm.   Reports active fetal movement, contractions: irregular, vaginal bleeding: none, membranes: intact.  Initiated prenatal care at [facility] at 10 wks.   Most recent u/s 2/21 @ 36.0wk, EFW 82%/3134g/6lb15oz, AFI [id number] cephalic.   This pregnancy complicated by: [id number] @ 16wks Mild polyhdramnios  Prenatal History/Complications:  Term SVB x 5, h/o GDM  Past Medical History: Past Medical History:  Diagnosis Date   Gestational diabetes     Past Surgical History: Past Surgical History:  Procedure Laterality Date   HERNIA REPAIR      Obstetrical History: OB History     Gravida  6   Para  5   Term  5   Preterm      AB      Living  5      SAB      IAB      Ectopic      Multiple      Live Births  5           Social History: Social History   Socioeconomic History   Marital status: Single    Spouse name: Not on file   Number of children: Not on file   Years of education: Not on file   Highest education level: Not on file  Occupational History   Not on file  Tobacco Use   Smoking status: Every Day    Packs/day: 0.50    Types: Cigarettes   Smokeless tobacco: Not on file  Vaping Use   Vaping Use: Never used  Substance and Sexual Activity   Alcohol use: No   Drug use: No   Sexual activity: Yes    Birth control/protection: None  Other Topics Concern   Not on file  Social History Narrative   Not on file   Social Determinants of Health   Financial Resource Strain: Not on file  Food Insecurity: No Food Insecurity (06/24/2022)   Hunger Vital Sign    Worried About Running Out of Food in the Last Year: Never true    Ran Out of Food in the Last Year: Never true  Transportation Needs: No Transportation Needs (06/24/2022)   PRAPARE - THydrologist(Medical): No    Lack of Transportation (Non-Medical): No   Physical Activity: Not on file  Stress: Not on file  Social Connections: Not on file    Family History: Family History  Problem Relation Age of Onset   Diabetes Mother    Diabetes Father     Allergies: Allergies  Allergen Reactions   Bactrim [Sulfamethoxazole-Trimethoprim] Nausea Only    Medications Prior to Admission  Medication Sig Dispense Refill Last Dose   cyclobenzaprine (FLEXERIL) 10 MG tablet Take 1 tablet (10 mg total) by mouth 2 (two) times daily as needed for muscle spasms. 20 tablet 0 Past Week   Ferrous Sulfate (IRON PO) Take 1 tablet by mouth daily.   Past Week   FOLIC ACID PO Take by mouth.   Past Week   Accu-Chek Softclix Lancets lancets 1 each by Other route 4 (four) times daily. Use as instructed 100 each 12    Blood Glucose Monitoring Suppl (ACCU-CHEK GUIDE) w/Device KIT 1 Device by Does not apply route 4 (four) times daily. 1 kit 0    glucose blood (ACCU-CHEK GUIDE) test strip Use as instructed for testing 4 times a day.  100 each 12     Review of Systems  Pertinent pos/neg as indicated in HPI  Blood pressure 116/66, pulse 96, temperature 97.9 F (36.6 C), temperature source Oral, resp. rate 18, height '5\' 1"'$  (1.549 m), weight 73.9 kg, last menstrual period 09/24/2021. General appearance: alert, cooperative, and no distress Lungs: clear to auscultation bilaterally Heart: regular rate and rhythm Abdomen: gravid, soft, non-tender Extremities: tr edema  Fetal monitoring: FHR: 115 bpm, variability: moderate,  Accelerations: Present,  decelerations:  Absent Uterine activity: irregular Dilation: 2 Effacement (%): 80 Station: Ballotable Exam by:: Knute Neu, Midwife Presentation: cephalic   Prenatal labs: ABO, Rh: --/--/PENDING (03/13 0017) Antibody: PENDING (03/13 0017) Rubella: 2.63 (08/24 1031) RPR: Non Reactive (12/14 0931)  HBsAg: Negative (08/24 1031)  HIV: Non Reactive (12/14 0931)  GBS: Positive/-- (02/22 0858)  2hr GTT:  abnormal  Results for orders placed or performed during the hospital encounter of 06/24/22 (from the past 24 hour(s))  Type and screen   Collection Time: 06/24/22 12:17 AM  Result Value Ref Range   ABO/RH(D) PENDING    Antibody Screen PENDING    Sample Expiration      06/27/2022,2359 Performed at [facility], [address] [place], [place] [address]   CBC   Collection Time: 06/24/22 12:58 AM  Result Value Ref Range   WBC 10.7 (H) 4.0 - 10.5 K/uL   RBC 3.82 (L) 3.87 - 5.11 MIL/uL   Hemoglobin 10.7 (L) 12.0 - 15.0 g/dL   HCT 31.8 (L) 36.0 - 46.0 %   MCV 83.2 80.0 - 100.0 fL   MCH 28.0 26.0 - 34.0 pg   MCHC 33.6 30.0 - 36.0 g/dL   RDW 14.6 11.5 - 15.5 %   Platelets 276 150 - 400 K/uL   nRBC 0.0 0.0 - 0.2 %     Assessment:  52w0dSIUP  G6P5005  IOL d/t A1/BDM dx @ 16wks, mild poly 26cm   Cat 1 FHR  GBS Positive/-- (02/22 0858)  Plan:  Admit to L&D  IV pain meds/epidural prn active labor  PCN for GBS+  CBGs per protocol  Pit per protocol  Anticipate NSVB   Plans to breastfeed  Contraception: withdrawal  Circumcision: no  KRoma SchanzCNM, WHNP-BC 06/24/2022, 2:00 AM

## 2022-06-24 NOTE — Discharge Summary (Shared)
Postpartum Discharge Summary  Date of Service updated***     Patient Name: Kristen [name] DOB: [birth year] MRN: [id number]  Date of admission: 06/24/2022 Delivery date:06/24/2022  Delivering provider: Deloris Ping  Date of discharge: 06/24/2022  Admitting diagnosis: Encounter for induction of labor [Z34.90] Intrauterine pregnancy: [redacted]w[redacted]d    Secondary diagnosis:  Principal Problem:   Encounter for induction of labor Active Problems:   Supervision of high risk pregnancy, antepartum   Antepartum multigravida of advanced maternal age   Gestational diabetes mellitus   Polyhydramnios affecting pregnancy   GBS (group B Streptococcus carrier), +RV culture, currently pregnant  Additional problems: ***    Discharge diagnosis: {DX.:23714}                                              Post partum procedures:{Postpartum procedures:23558} Augmentation: {123456Complications: {OB Labor/Delivery Complications:20784}  Hospital course: {Courses:23701}  Magnesium Sulfate received: {Mag received:30440022} BMZ received: {BMZ received:30440023} Rhophylac:{Rhophylac received:30440032} MMR:{MMR:30440033} T-DaP:{Tdap:23962} Flu: {WG:1132360Transfusion:{Transfusion received:30440034}  Physical exam  Vitals:   06/24/22 1154 06/24/22 1238 06/24/22 1306 06/24/22 1402  BP: 124/82 131/84 115/73 119/66  Pulse: 92 89 93 84  Resp: '18 18 16 16  '$ Temp:      TempSrc:      Weight:      Height:       General: {Exam; general:21111117} Lochia: {Desc; appropriate/inappropriate:30686::"appropriate"} Uterine Fundus: {Desc; firm/soft:30687} Incision: {Exam; incision:21111123} DVT Evaluation: {Exam; dvt:2111122} Labs: Lab Results  Component Value Date   WBC 10.7 (H) 06/24/2022   HGB 10.7 (L) 06/24/2022   HCT 31.8 (L) 06/24/2022   MCV 83.2 06/24/2022   PLT 276 06/24/2022      Latest Ref Rng & Units 06/20/2022    3:25 PM  CMP  Glucose 70 - 99 mg/dL 87   BUN 6 - 20 mg/dL 10    Creatinine 0.44 - 1.00 mg/dL 0.59   Sodium 135 - 145 mmol/L 136   Potassium 3.5 - 5.1 mmol/L 3.7   Chloride 98 - 111 mmol/L 104   CO2 22 - 32 mmol/L 24   Calcium 8.9 - 10.3 mg/dL 9.1   Total Protein 6.5 - 8.1 g/dL 5.9   Total Bilirubin 0.3 - 1.2 mg/dL 0.3   Alkaline Phos 38 - 126 U/L 120   AST 15 - 41 U/L 22   ALT 0 - 44 U/L 17    Edinburgh Score:     No data to display           After visit meds:  Allergies as of 06/24/2022       Reactions   Bactrim [sulfamethoxazole-trimethoprim] Nausea Only     Med Rec must be completed [facility] to [facility]**        Discharge home in stable condition Infant Feeding: {Baby feeding:23562} Infant Disposition:{CHL IP OB HOME WITH MDX:[id number] instruction: per After Visit Summary and Postpartum booklet. Activity: Advance as tolerated. Pelvic rest for 6 weeks.  Diet: {OB dBY:630183Future Appointments: Future Appointments  Date Time Provider [place] 07/23/2022 10:15 AM STruett Mainland DO [facility] None   Follow up Visit:   Please schedule this patient for a Virtual postpartum visit in 4 weeks with the following provider: Any provider. Additional Postpartum F/U:2 hour GTT  Low risk pregnancy complicated by: GDM Delivery mode:  Vaginal, Spontaneous  Anticipated Birth Control:  Natural family planning   Message sent on 06/24/22 @ 2:10 PM  06/24/2022 Jacquiline Doe, CNM

## 2022-06-24 NOTE — Progress Notes (Signed)
Kristen [name] is a 37 y.o. G6P5005 at 35w0dby LMP admitted for induction of labor due to diet controlled Gestational diabetes, with mild polyhydramnios.  Subjective: Patient is doing well with epidural. Introductions exchanged. Excited to meet baby boy.   Objective: BP 109/75   Pulse 72   Temp (!) 97.5 F (36.4 C) (Oral)   Resp 16   Ht '5\' 1"'$  (1.549 m)   Wt 73.9 kg   LMP 09/24/2021 (Exact Date)   BMI 30.80 kg/m  No intake/output data recorded. No intake/output data recorded.  FHT:  FHR: 120 bpm, variability: moderate,  accelerations:  Present,  decelerations:  Absent UC:   regular, every 2-3 minutes SVE:   Dilation: 3 Effacement (%): 70 Station: Ballotable Exam by:: [place] rn  Labs: Lab Results  Component Value Date   WBC 10.7 (H) 06/24/2022   HGB 10.7 (L) 06/24/2022   HCT 31.8 (L) 06/24/2022   MCV 83.2 06/24/2022   PLT 276 06/24/2022    Assessment / Plan: Induction of labor due to gestational diabetes,  progressing well on pitocin  Labor: Progressing on Pitocin, will continue to increase then AROM.  Fetal Wellbeing:  Category I- Continue to monitor  Pain Control:  Epidural I/D:   GBS Positive < PCN  Anticipated MOD:  NSVD  SJacquiline Doe CNM 06/24/2022, 9:08 AM

## 2022-06-24 NOTE — Anesthesia Preprocedure Evaluation (Signed)
Anesthesia Evaluation    Reviewed: Allergy & Precautions, Patient's Chart, lab work & pertinent test results  History of Anesthesia Complications Negative for: history of anesthetic complications  Airway Mallampati: II  TM Distance: >3 FB Neck ROM: Full    Dental no notable dental hx.    Pulmonary Current Smoker   Pulmonary exam normal        Cardiovascular negative cardio ROS Normal cardiovascular exam     Neuro/Psych negative neurological ROS  negative psych ROS   GI/Hepatic negative GI ROS, Neg liver ROS,,,  Endo/Other  diabetes, Gestational    Renal/GU negative Renal ROS  negative genitourinary   Musculoskeletal negative musculoskeletal ROS (+)    Abdominal   Peds  Hematology negative hematology ROS (+)   Anesthesia Other Findings Day of surgery medications reviewed with patient.  Reproductive/Obstetrics (+) Pregnancy                              Anesthesia Physical Anesthesia Plan  ASA: 2  Anesthesia Plan: Epidural   Post-op Pain Management:    Induction:   PONV Risk Score and Plan: Treatment may vary due to age or medical condition  Airway Management Planned: Natural Airway  Additional Equipment: Fetal Monitoring  Intra-op Plan:   Post-operative Plan:   Informed Consent: I have reviewed the patients History and Physical, chart, labs and discussed the procedure including the risks, benefits and alternatives for the proposed anesthesia with the patient or authorized representative who has indicated his/her understanding and acceptance.       Plan Discussed with:   Anesthesia Plan Comments:          Anesthesia Quick Evaluation

## 2022-06-25 LAB — CBC
HCT: 32.7 % — ABNORMAL LOW (ref 36.0–46.0)
Hemoglobin: 10.9 g/dL — ABNORMAL LOW (ref 12.0–15.0)
MCH: 28 pg (ref 26.0–34.0)
MCHC: 33.3 g/dL (ref 30.0–36.0)
MCV: 84.1 fL (ref 80.0–100.0)
Platelets: 242 10*3/uL (ref 150–400)
RBC: 3.89 MIL/uL (ref 3.87–5.11)
RDW: 14.6 % (ref 11.5–15.5)
WBC: 10.2 10*3/uL (ref 4.0–10.5)
nRBC: 0 % (ref 0.0–0.2)

## 2022-06-25 MED ORDER — IBUPROFEN 600 MG PO TABS: 600.0000 mg | ORAL_TABLET | Freq: Four times a day (QID) | ORAL | 0 refills | Status: DC | PRN

## 2022-06-25 NOTE — Progress Notes (Addendum)
Patient ID: Kristen [name], female   DOB: April 15, 1985, 37 y.o.   MRN: [id number]  POSTPARTUM PROGRESS NOTE  Post Partum Day 1  Subjective:  Kristen [name] is a 37 y.o. MA:[id number] s/p SVD at [redacted]w[redacted]d  She reports she is doing well. No acute events overnight. She denies any problems with ambulating, voiding or po intake. Denies nausea or vomiting. No headache, dizziness, vision changes, CP, or SOB. Pain is well controlled with only occasional cramping.  Lochia is minimal to moderate.  Objective: Blood pressure 119/77, pulse 76, temperature 98.1 F (36.7 C), temperature source Oral, resp. rate 16, height '5\' 1"'$  (1.549 m), weight 73.9 kg, last menstrual period 09/24/2021, SpO2 98 %, unknown if currently breastfeeding.  Physical Exam:  General: alert, cooperative and no distress Chest: no respiratory distress Abdomen: soft, nontender,  Uterine Fundus: firm, appropriately tender DVT Evaluation: No calf swelling or tenderness Extremities: Trace edema Skin: warm, dry  Recent Labs    06/24/22 0058 06/25/22 0444  HGB 10.7* 10.9*  HCT 31.8* 32.7*    Assessment/Plan: SEtrulia Cois a 37y.o. [id number]/p SVD at 336w0d PPD#1  - Doing well. No concerns.  Routine postpartum care - Contraception: None - Feeding: Breast  Dispo - Plan for discharge today.   LOS: 1 day   AmCarmelina DaneMedical Student 06/25/2022, 6:34 AM

## 2022-07-02 ENCOUNTER — Telehealth (HOSPITAL_COMMUNITY): Payer: Self-pay | Admitting: *Deleted

## 2022-07-02 NOTE — Telephone Encounter (Signed)
Attempted hospital discharge follow-up call. Left message for patient to return RN call with any questions or concerns. Erline Levine, RN, 07/02/22, [phone]

## 2022-07-23 ENCOUNTER — Ambulatory Visit: Payer: Medicaid Other | Admitting: Family Medicine

## 2022-08-06 ENCOUNTER — Ambulatory Visit: Payer: Medicaid Other | Admitting: Family Medicine

## 2022-11-02 ENCOUNTER — Encounter: Payer: Self-pay | Admitting: Family Medicine

## 2022-12-02 ENCOUNTER — Ambulatory Visit: Payer: Medicaid Other | Admitting: Family Medicine

## 2023-08-13 ENCOUNTER — Ambulatory Visit: Admitting: *Deleted

## 2023-08-13 ENCOUNTER — Telehealth: Payer: Self-pay

## 2023-08-13 ENCOUNTER — Other Ambulatory Visit (INDEPENDENT_AMBULATORY_CARE_PROVIDER_SITE_OTHER): Payer: Self-pay

## 2023-08-13 VITALS — BP 125/79 | HR 81 | Wt 147.0 lb

## 2023-08-13 DIAGNOSIS — Z3481 Encounter for supervision of other normal pregnancy, first trimester: Secondary | ICD-10-CM | POA: Diagnosis not present

## 2023-08-13 DIAGNOSIS — Z3A08 8 weeks gestation of pregnancy: Secondary | ICD-10-CM | POA: Diagnosis not present

## 2023-08-13 DIAGNOSIS — O09899 Supervision of other high risk pregnancies, unspecified trimester: Secondary | ICD-10-CM | POA: Insufficient documentation

## 2023-08-13 DIAGNOSIS — O09299 Supervision of pregnancy with other poor reproductive or obstetric history, unspecified trimester: Secondary | ICD-10-CM | POA: Insufficient documentation

## 2023-08-13 DIAGNOSIS — O3680X0 Pregnancy with inconclusive fetal viability, not applicable or unspecified: Secondary | ICD-10-CM

## 2023-08-13 DIAGNOSIS — O09529 Supervision of elderly multigravida, unspecified trimester: Secondary | ICD-10-CM | POA: Insufficient documentation

## 2023-08-13 DIAGNOSIS — O0990 Supervision of high risk pregnancy, unspecified, unspecified trimester: Secondary | ICD-10-CM | POA: Insufficient documentation

## 2023-08-13 NOTE — Progress Notes (Signed)
 New OB Intake  I explained I am completing New OB Intake today. We discussed EDD of 03/18/2024, by Ultrasound. Pt is G7P6006. I reviewed her allergies, medications and Medical/Surgical/OB history.    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  Patient also informed that the ultrasound is not being completed with the intent of assessing for fetal or placental anomalies or any pelvic abnormalities. Explained that the purpose of today's ultrasound is to assess for viability.  Patient acknowledges the purpose of the exam and the limitations of the study.     Delivery Plans Plans to deliver at [facility]. Discussed the nature of our practice with multiple providers including residents and students. Due to the size of the practice, the delivering provider may not be the same as those providing prenatal care.   MyChart/Babyscripts MyChart access verified. I explained pt will have some visits in office and some virtually. Babyscripts app discussed and ordered.   Blood Pressure Cuff Blood pressure cuff discussed and needs to be given. Discussed to be used for virtual visits and or if needed BP checks weekly.  Anatomy US  Explained first scheduled US  will be around 19 weeks.   Last Pap Diagnosis  Date Value Ref Range Status  12/04/2021   Final   - Negative for intraepithelial lesion or malignancy (NILM)    First visit review I reviewed new OB appt with patient. Explained pt will be seen by Dr Alto Atta at first visit. Discussed Linard Reno genetic screening with patient will get Panorama at [facility], had Horizon  with last pregnancy. Routine prenatal labs to be collected at [facility].    Bettey Browning, RN 08/13/2023  9:02 AM

## 2023-08-26 ENCOUNTER — Encounter: Payer: Self-pay | Admitting: Obstetrics and Gynecology

## 2023-08-26 ENCOUNTER — Other Ambulatory Visit (HOSPITAL_COMMUNITY)
Admission: RE | Admit: 2023-08-26 | Discharge: 2023-08-26 | Disposition: A | Discharge status: Home / Self Care | Source: Ambulatory Visit | Attending: Obstetrics and Gynecology | Admitting: Obstetrics and Gynecology

## 2023-08-26 ENCOUNTER — Ambulatory Visit: Admitting: Obstetrics and Gynecology

## 2023-08-26 VITALS — BP 114/77 | HR 71 | Wt 143.0 lb

## 2023-08-26 DIAGNOSIS — Z1331 Encounter for screening for depression: Secondary | ICD-10-CM | POA: Diagnosis not present

## 2023-08-26 DIAGNOSIS — Z3A10 10 weeks gestation of pregnancy: Secondary | ICD-10-CM

## 2023-08-26 DIAGNOSIS — O09899 Supervision of other high risk pregnancies, unspecified trimester: Secondary | ICD-10-CM

## 2023-08-26 DIAGNOSIS — O09299 Supervision of pregnancy with other poor reproductive or obstetric history, unspecified trimester: Secondary | ICD-10-CM

## 2023-08-26 DIAGNOSIS — O0990 Supervision of high risk pregnancy, unspecified, unspecified trimester: Secondary | ICD-10-CM

## 2023-08-26 DIAGNOSIS — O09521 Supervision of elderly multigravida, first trimester: Secondary | ICD-10-CM

## 2023-08-26 DIAGNOSIS — O09291 Supervision of pregnancy with other poor reproductive or obstetric history, first trimester: Secondary | ICD-10-CM | POA: Diagnosis not present

## 2023-08-26 DIAGNOSIS — O0941 Supervision of pregnancy with grand multiparity, first trimester: Secondary | ICD-10-CM

## 2023-08-26 DIAGNOSIS — Z8719 Personal history of other diseases of the digestive system: Secondary | ICD-10-CM | POA: Insufficient documentation

## 2023-08-26 DIAGNOSIS — O0940 Supervision of pregnancy with grand multiparity, unspecified trimester: Secondary | ICD-10-CM | POA: Insufficient documentation

## 2023-08-26 DIAGNOSIS — Z8759 Personal history of other complications of pregnancy, childbirth and the puerperium: Secondary | ICD-10-CM | POA: Insufficient documentation

## 2023-08-26 DIAGNOSIS — Z8632 Personal history of gestational diabetes: Secondary | ICD-10-CM

## 2023-08-26 NOTE — Progress Notes (Signed)
 Chief Complaint  Patient presents with   Initial Prenatal Visit    Subjective:   Kristen [name] is a 38 y.o. G7P6006 at [redacted]w[redacted]d by 8 wk ultrasound being seen today for her first obstetrical visit.    History is significant for: Grandmultiparity Advanced maternal age GDM in multiple pregnancies Polyhydramnios in her last pregnancy Hx umbilical hernia repair with mesh Short interval pregnancy- last birth 06/24/2022  Patient does intend to breast feed. Pregnancy history fully reviewed.  Patient reports no complaints.  HISTORY: OB History  Gravida Para Term Preterm AB Living  7 6 6  0 0 6  SAB IAB Ectopic Multiple Live Births  0 0 0 0 6    # Outcome Date GA Lbr Len/2nd Weight Sex Type Anes PTL Lv  7 Current           6 Term 06/24/22 [redacted]w[redacted]d 01:45 / 00:16 8 lb 10.6 oz (3.93 kg) M Vag-Spont EPI  LIV     Name: Backer,BOY Chantell     Apgar1: 8  Apgar5: 9  5 Term 10/29/19   8 lb 9.9 oz (3.91 kg) F Vag-Spont   LIV  4 Term 06/22/16   8 lb 11 oz (3.941 kg) F Vag-Spont   LIV  3 Term 06/16/13   7 lb 9.9 oz (3.456 kg) F Vag-Spont   LIV  2 Term 06/25/10   8 lb 5 oz (3.771 kg) F Vag-Spont   LIV  1 Term 05/09/04   7 lb 11 oz (3.487 kg) M Vag-Spont   LIV     Last pap smear: Lab Results  Component Value Date   DIAGPAP  12/04/2021    - Negative for intraepithelial lesion or malignancy (NILM)   HPVHIGH Negative 12/04/2021     Past Medical History:  Diagnosis Date   Gestational diabetes    History of polyhydramnios 08/26/2023   Umbilical hernia without obstruction and without gangrene 05/17/2017   Formatting of this note might be different from the original.  Added automatically from request for surgery [id number]     Past Surgical History:  Procedure Laterality Date   HERNIA REPAIR  05/19/2017   with mesh   Family History  Problem Relation Age of Onset   Diabetes Maternal Grandmother    Diabetes Maternal Grandfather    Diabetes Paternal Grandmother    Diabetes Paternal Grandfather     Social History   Tobacco Use   Smoking status: Former    Current packs/day: 0.50    Average packs/day: 0.5 packs/day for 5.0 years (2.5 ttl pk-yrs)    Types: Cigarettes    Start date: 08/13/2018  Vaping Use   Vaping status: Never Used  Substance Use Topics   Alcohol use: No   Drug use: No   Allergies  Allergen Reactions   Bactrim  [Sulfamethoxazole -Trimethoprim ] Nausea Only   Current Outpatient Medications on File Prior to Visit  Medication Sig Dispense Refill   folic acid (FOLVITE) 400 MCG tablet Take 400 mcg by mouth daily.     No current facility-administered medications on file prior to visit.     Exam   Vitals:   08/26/23 1335  BP: 114/77  Pulse: 71  Weight: 143 lb (64.9 kg)   Fetal Heart Rate (bpm): 172  General:  Alert, oriented and cooperative. Patient is in no acute distress.  Breast: deferred  Cardiovascular: Normal heart rate noted  Respiratory: Normal respiratory effort, no problems with respiration noted  Abdomen: Soft, gravid, appropriate for gestational age.  Pain/Pressure:  Absent     Pelvic: EGBUS within normal limits, normal vagina without bleeding, cervix closed, uterus gravid consistent with dates        Extremities: Normal range of motion.  Edema: None  Mental Status: Normal mood and affect. Normal behavior. Normal judgment and thought content.   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 Supervision of high risk  pregnancy, antepartum (Primary) Doing well, anticipatory guidance. PNV encouraged (only taking folic acid) - CBC/D/Plt+RPR+Rh+ABO+RubIgG... - GC/Chlamydia probe amp ([place])not at [facility] - Culture, OB Urine - Hemoglobin A1c - PANORAMA PRENATAL TEST  2. Multigravida of advanced maternal age in first trimester NIPT, MFM ultrasound planned - PANORAMA PRENATAL TEST  3. History of gestational diabetes in prior pregnancy, currently pregnant A1c today Early 2 hr- pt will complete at next appt  4. History of polyhydramnios   5. Grand multiparity with antenatal problem, antepartum Caution at delivery  6. History of umbilical hernia repair +mesh  7. Short interval between pregnancies affecting pregnancy, antepartum Counseled on risks  8. Breast feeding status of mother Counseled on continuing breastfeeding during pregnancy and tandem breastfeeding if desired    Initial labs drawn. Continue prenatal vitamins. Genetic Screening discussed: NIPS, carrier screening and AFP, requested. Ultrasound discussed; fetal anatomic survey: requested. Problem list reviewed and updated. The nature of  - [facility] with multiple MDs and other Advanced Practice Providers was explained to patient; also emphasized that residents, students are part of our team. Routine obstetric precautions reviewed. No follow-ups on file.  Marci Setter, MD, FACOG Obstetrician & Gynecologist, [facility] for [facility], [facility]

## 2023-08-27 ENCOUNTER — Ambulatory Visit: Payer: Self-pay | Admitting: Obstetrics and Gynecology

## 2023-08-27 DIAGNOSIS — O99810 Abnormal glucose complicating pregnancy: Secondary | ICD-10-CM | POA: Insufficient documentation

## 2023-08-27 LAB — CBC/D/PLT+RPR+RH+ABO+RUBIGG...
Antibody Screen: NEGATIVE
Basophils Absolute: 0 10*3/uL (ref 0.0–0.2)
Basos: 0 %
EOS (ABSOLUTE): 0.1 10*3/uL (ref 0.0–0.4)
Eos: 1 %
HCV Ab: NONREACTIVE
HIV Screen 4th Generation wRfx: NONREACTIVE
Hematocrit: 39.2 % (ref 34.0–46.6)
Hemoglobin: 13.1 g/dL (ref 11.1–15.9)
Hepatitis B Surface Ag: NEGATIVE
Immature Grans (Abs): 0 10*3/uL (ref 0.0–0.1)
Immature Granulocytes: 0 %
Lymphocytes Absolute: 1.9 10*3/uL (ref 0.7–3.1)
Lymphs: 23 %
MCH: 30.3 pg (ref 26.6–33.0)
MCHC: 33.4 g/dL (ref 31.5–35.7)
MCV: 91 fL (ref 79–97)
Monocytes Absolute: 0.5 10*3/uL (ref 0.1–0.9)
Monocytes: 6 %
Neutrophils Absolute: 5.9 10*3/uL (ref 1.4–7.0)
Neutrophils: 70 %
Platelets: 298 10*3/uL (ref 150–450)
RBC: 4.32 x10E6/uL (ref 3.77–5.28)
RDW: 13.3 % (ref 11.7–15.4)
RPR Ser Ql: NONREACTIVE
Rh Factor: POSITIVE
Rubella Antibodies, IGG: 2.12 {index} (ref >0.99)
WBC: 8.5 10*3/uL (ref 3.4–10.8)

## 2023-08-27 LAB — HEMOGLOBIN A1C
Est. average glucose Bld gHb Est-mCnc: 120 mg/dL
Hgb A1c MFr Bld: 5.8 % — ABNORMAL HIGH (ref 4.8–5.6)

## 2023-08-27 LAB — HCV INTERPRETATION

## 2023-08-28 LAB — URINE CULTURE, OB REFLEX

## 2023-08-28 LAB — CULTURE, OB URINE

## 2023-08-31 LAB — CERVICOVAGINAL ANCILLARY ONLY
Chlamydia: NEGATIVE
Comment: NEGATIVE
Comment: NEGATIVE
Comment: NORMAL
Neisseria Gonorrhea: NEGATIVE
Trichomonas: NEGATIVE

## 2023-09-02 LAB — PANORAMA PRENATAL TEST FULL PANEL:PANORAMA TEST PLUS 5 ADDITIONAL MICRODELETIONS: FETAL FRACTION: 8.8

## 2023-09-22 ENCOUNTER — Ambulatory Visit: Admitting: Obstetrics and Gynecology

## 2023-09-22 VITALS — BP 107/76 | HR 74 | Wt 147.0 lb

## 2023-09-22 DIAGNOSIS — Z3A14 14 weeks gestation of pregnancy: Secondary | ICD-10-CM

## 2023-09-22 DIAGNOSIS — O09522 Supervision of elderly multigravida, second trimester: Secondary | ICD-10-CM

## 2023-09-22 DIAGNOSIS — O99810 Abnormal glucose complicating pregnancy: Secondary | ICD-10-CM

## 2023-09-22 DIAGNOSIS — O0990 Supervision of high risk pregnancy, unspecified, unspecified trimester: Secondary | ICD-10-CM

## 2023-09-22 DIAGNOSIS — O09299 Supervision of pregnancy with other poor reproductive or obstetric history, unspecified trimester: Secondary | ICD-10-CM | POA: Diagnosis not present

## 2023-09-22 DIAGNOSIS — Z8632 Personal history of gestational diabetes: Secondary | ICD-10-CM

## 2023-09-22 NOTE — Progress Notes (Signed)
   PRENATAL VISIT NOTE  Subjective:  Kristen [name] is a 38 y.o. G7P6006 at [redacted]w[redacted]d being seen today for ongoing prenatal care.  She is currently monitored for the following issues for this high-risk pregnancy and has Supervision of high risk pregnancy, antepartum; Horseshoe kidney; Short interval between pregnancies affecting pregnancy, antepartum; History of gestational diabetes in prior pregnancy, currently pregnant; Advanced maternal age in multigravida; History of polyhydramnios; Grand multiparity with antenatal problem, antepartum; History of umbilical hernia repair; Breast feeding status of mother; and Prediabetes in mother during pregnancy on their problem list.  Patient reports no complaints.  Contractions: Not present. Vag. Bleeding: None.  Movement: Absent. Denies leaking of fluid.   The following portions of the patient's history were reviewed and updated as appropriate: allergies, current medications, past family history, past medical history, past social history, past surgical history and problem list.   Objective:   Vitals:   09/22/23 0948  BP: 107/76  Pulse: 74  Weight: 147 lb (66.7 kg)   Body mass index is 27.78 kg/m. Total weight gain: 0 lb (0 kg)   Fetal Status:     Movement: Absent     General:  Alert, oriented and cooperative. Patient is in no acute distress.  Skin: Skin is warm and dry. No rash noted.   Cardiovascular: Normal heart rate noted  Respiratory: Normal respiratory effort, no problems with respiration noted  Abdomen: Soft, gravid, appropriate for gestational age.  Pain/Pressure: Absent     Pelvic: Cervical exam deferred        Extremities: Normal range of motion.  Edema: None  Mental Status: Normal mood and affect. Normal behavior. Normal judgment and thought content.   Assessment and Plan:  Pregnancy: G7P6006 at [redacted]w[redacted]d 1. Supervision of high risk pregnancy, antepartum Doing well, anticipatory guidance  2. Multigravida of advanced maternal age in  second trimester NIPT low risk  3. History of gestational diabetes in prior pregnancy, currently pregnant 2 hr advised, planned next week  4. Prediabetes in mother during pregnancy See above  5. [redacted] weeks gestation of pregnancy (Primary)   Preterm labor symptoms and general obstetric precautions including but not limited to vaginal bleeding, contractions, leaking of fluid and fetal movement were reviewed in detail with the patient. Please refer to After Visit Summary for other counseling recommendations.   No follow-ups on file.  Future Appointments  Date Time Provider Department Center  09/22/2023  9:55 AM Alto Atta, Jodelle Mungo, MD [facility] None  09/29/2023  8:15 AM [facility] NURSE [facility] None  10/20/2023 11:15 AM Malka Sea, DO [facility] None  10/27/2023  8:00 AM [facility] PROVIDER 1 [facility]  10/27/2023  8:30 AM [facility] US3 [facility]  11/17/2023  9:55 AM Cathyann Cobia, Pierrette Brick, DO [facility] None    Marci Setter, MD

## 2023-09-29 ENCOUNTER — Other Ambulatory Visit

## 2023-09-29 DIAGNOSIS — O0990 Supervision of high risk pregnancy, unspecified, unspecified trimester: Secondary | ICD-10-CM

## 2023-09-29 NOTE — Addendum Note (Signed)
 Addended by: Ardean Beat A on: 09/29/2023 08:41 AM   Modules accepted: Orders

## 2023-09-29 NOTE — Progress Notes (Addendum)
 Patient here to pick up lab requisition.  Sent to lab.  Arrie Lares RN   Patient refused testing.  States the label clearly states for non pregnant patients.  Will obtain her own with natural ingredients and reschedule.  Dr. Alto Atta aware.  Arrie Lares

## 2023-09-29 NOTE — Addendum Note (Signed)
 Addended by: Ardean Beat A on: 09/29/2023 08:26 AM   Modules accepted: Orders

## 2023-10-20 ENCOUNTER — Encounter: Admitting: Family Medicine

## 2023-10-27 ENCOUNTER — Ambulatory Visit: Attending: Family Medicine

## 2023-10-27 ENCOUNTER — Ambulatory Visit (HOSPITAL_BASED_OUTPATIENT_CLINIC_OR_DEPARTMENT_OTHER): Admitting: Maternal & Fetal Medicine

## 2023-10-27 ENCOUNTER — Other Ambulatory Visit: Payer: Self-pay | Admitting: *Deleted

## 2023-10-27 VITALS — BP 129/63 | HR 68

## 2023-10-27 DIAGNOSIS — O09299 Supervision of pregnancy with other poor reproductive or obstetric history, unspecified trimester: Secondary | ICD-10-CM | POA: Insufficient documentation

## 2023-10-27 DIAGNOSIS — Z8632 Personal history of gestational diabetes: Secondary | ICD-10-CM | POA: Insufficient documentation

## 2023-10-27 DIAGNOSIS — Z3A19 19 weeks gestation of pregnancy: Secondary | ICD-10-CM | POA: Diagnosis present

## 2023-10-27 DIAGNOSIS — O358XX0 Maternal care for other (suspected) fetal abnormality and damage, not applicable or unspecified: Secondary | ICD-10-CM

## 2023-10-27 DIAGNOSIS — O09522 Supervision of elderly multigravida, second trimester: Secondary | ICD-10-CM

## 2023-10-27 DIAGNOSIS — Z8776 Personal history of (corrected) congenital diaphragmatic hernia or other congenital diaphragm malformations: Secondary | ICD-10-CM

## 2023-10-27 DIAGNOSIS — O09892 Supervision of other high risk pregnancies, second trimester: Secondary | ICD-10-CM

## 2023-10-27 DIAGNOSIS — O09899 Supervision of other high risk pregnancies, unspecified trimester: Secondary | ICD-10-CM | POA: Insufficient documentation

## 2023-10-27 DIAGNOSIS — O0990 Supervision of high risk pregnancy, unspecified, unspecified trimester: Secondary | ICD-10-CM | POA: Insufficient documentation

## 2023-10-27 DIAGNOSIS — O09292 Supervision of pregnancy with other poor reproductive or obstetric history, second trimester: Secondary | ICD-10-CM

## 2023-10-27 DIAGNOSIS — O0942 Supervision of pregnancy with grand multiparity, second trimester: Secondary | ICD-10-CM

## 2023-10-27 DIAGNOSIS — O0940 Supervision of pregnancy with grand multiparity, unspecified trimester: Secondary | ICD-10-CM

## 2023-10-27 NOTE — Progress Notes (Signed)
 MFM Consultation  Ms. [name] [name] is a 38 yo G7P6 who is seen at 19w 4d with an EDD of 03/18/24.  She is seen for a detailed exam given advanced maternal age and grand multiparity.   She is doing well today without complaints.  Today's ultrasound revealed the following:  Single intrauterine pregnancy with measurements consistent with dates.  Good fetal movement and amniotic fluid volume. Anatomy was complete without additional markers of aneuploidy. Suboptimal views of the spine was also observed secondary to fetal position. We also observed a circumvallate placenta and persistent right umbilical vein.  OB History  Gravida Para Term Preterm AB Living  7 6 6  0 0 6  SAB IAB Ectopic Multiple Live Births  0 0 0 0 6    # Outcome Date GA Lbr Len/2nd Weight Sex Type Anes PTL Lv  7 Current           6 Term 06/24/22 [redacted]w[redacted]d 01:45 / 00:16 8 lb 10.6 oz (3.93 kg) M Vag-Spont EPI  LIV     Name: [name] [name]     Apgar1: 8  Apgar5: 9  5 Term 10/29/19   8 lb 9.9 oz (3.91 kg) F Vag-Spont   LIV  4 Term 06/22/16   8 lb 11 oz (3.941 kg) F Vag-Spont   LIV  3 Term 06/16/13   7 lb 9.9 oz (3.456 kg) F Vag-Spont   LIV  2 Term 06/25/10   8 lb 5 oz (3.771 kg) F Vag-Spont   LIV  1 Term 05/09/04   7 lb 11 oz (3.487 kg) M Vag-Spont   LIV    Past Medical History:  Diagnosis Date   Gestational diabetes    History of polyhydramnios 08/26/2023   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  MFM anatomical survey wnl No current concerns     Umbilical hernia without obstruction and without gangrene 05/17/2017   Formatting of this note might be different from the original.  Added automatically from request for surgery [id number]     Past Surgical History:  Procedure Laterality Date   HERNIA REPAIR  05/19/2017   with mesh    Social History   Socioeconomic History   Marital status: Single    Spouse name: Not on file   Number of children: Not on file   Years of  education: Not on file   Highest education level: Not on file  Occupational History   Not on file  Tobacco Use   Smoking status: Former    Current packs/day: 0.50    Average packs/day: 0.5 packs/day for 5.2 years (2.6 ttl pk-yrs)    Types: Cigarettes    Start date: 08/13/2018   Smokeless tobacco: Not on file  Vaping Use   Vaping status: Never Used  Substance and Sexual Activity   Alcohol use: No   Drug use: No   Sexual activity: Yes    Birth control/protection: None  Other Topics Concern   Not on file  Social History Narrative   Not on file   Social Drivers of Health   Financial Resource Strain: Not on file  Food Insecurity: No Food Insecurity (06/24/2022)   Hunger Vital Sign    Worried About Running Out of Food in the Last Year: Never true    Ran Out of Food in the Last Year: Never true  Transportation Needs: No Transportation Needs (06/24/2022)   PRAPARE - Administrator, Civil Service (Medical):  No    Lack of Transportation (Non-Medical): No  Physical Activity: Not on file  Stress: Not on file  Social Connections: Unknown (08/25/2021)   Received from [facility]   Social Network    Social Network: Not on file  Intimate Partner Violence: Not At Risk (06/24/2022)   Humiliation, Afraid, Rape, and Kick questionnaire    Fear of Current or Ex-Partner: No    Emotionally Abused: No    Physically Abused: No    Sexually Abused: No        Current Outpatient Medications (Hematological):    folic acid (FOLVITE) 400 MCG tablet, Take 400 mcg by mouth daily.    Impression/Counseling:  1) Grandmutiparity: Grand multiparity is defined as greater than 5 live births. This condition is associated with an increased risk of placenta previa, post partum hemorrhage, placental abruption, and macrosomia. In the setting of grand multiparity, no deviation from standard obstetrical care is typically indicated, however, preparation for the above mentioned complications should  be made at the time of delivery.  2) History of polyhydramnios Consider growth and amniotic fluid check between 32-36 weeks  3) Circumvallate Placenta: Placenta circumvallata is a placental anomaly in which the membranous chorion transitions to a villous chorion at a distance from the placental edge. The result is a central depression surrounded by a thickened, raised, and plicated gray-white ring on the fetal surface of a placenta, and a chorionic plate that is smaller than the placental basal plate. The ring is composed of a double fold of chorion and amnion, with degenerated decidua and fibrin in between. The ring may be at varying distances from the periphery and may surround the entire circumference of the placenta or just a portion of it.  Many pregnancies have no symptoms that suggest the presence of placenta circumvallate, and good outcomes are very common.   Antepartum bleeding is the most common symptom, and this can occur at any gestational age although it is most common in the second trimester. Bleeding may be due to placental abruption, disruption of maternal vessels at the abnormal margin of the extrachorial placenta, or fetal hemorrhage.   A higher risk of placental abruption has been reported in pregnancies with circumvallate placenta.  A watery vaginal discharge, termed hydrorrhea gravidarum, has also been reported in 10% of pregnancies complicated by placenta circumvallata; and if it occurs it must be differentiated from rupture of the amniotic membranes.   We would recommend ongoing routine prenatal care, with counseling regarding bleeding and preterm labor precautions.  4) Persistent Right Umbilical Vein:  In general the right umbilical vein obliterates in majority of th time by the 7th week of pregnancy. At time it remains and the left umbilical vein persist.   PRUV can be intrahepatic and extra hepatic when extrahepatic it has been linked to chromosomal abnormal  abnormalities, genetic syndromes and cardiac defect. We did not observe any markers suggestive of a chromosomal anomaly or cardiac defect.  However given this association [name] [name] was concerned and I offered a pediatric echocardiogram. She agreed to this option in addition, I discussed today's findings with her significant other via phone as well Radonna)?   A referral to [facility] was sent.  In general I explained that I suspect a good overall outcome and normal pregnancy experience.   She will return in 4 weeks to complete the fetal anatomy.  All questions answered  I spent 45 minutes with > 50% in face to face consultation.  Nathanel DOROTHA Fetters, MD

## 2023-10-28 ENCOUNTER — Ambulatory Visit (INDEPENDENT_AMBULATORY_CARE_PROVIDER_SITE_OTHER): Admitting: Family Medicine

## 2023-10-28 VITALS — BP 125/72 | HR 78 | Wt 151.0 lb

## 2023-10-28 DIAGNOSIS — O0990 Supervision of high risk pregnancy, unspecified, unspecified trimester: Secondary | ICD-10-CM | POA: Diagnosis not present

## 2023-10-28 DIAGNOSIS — Z8632 Personal history of gestational diabetes: Secondary | ICD-10-CM

## 2023-10-28 DIAGNOSIS — L0292 Furuncle, unspecified: Secondary | ICD-10-CM

## 2023-10-28 DIAGNOSIS — O09899 Supervision of other high risk pregnancies, unspecified trimester: Secondary | ICD-10-CM | POA: Diagnosis not present

## 2023-10-28 DIAGNOSIS — Z3A19 19 weeks gestation of pregnancy: Secondary | ICD-10-CM | POA: Diagnosis not present

## 2023-10-28 DIAGNOSIS — O0940 Supervision of pregnancy with grand multiparity, unspecified trimester: Secondary | ICD-10-CM

## 2023-10-28 DIAGNOSIS — Z8759 Personal history of other complications of pregnancy, childbirth and the puerperium: Secondary | ICD-10-CM

## 2023-10-28 DIAGNOSIS — O43112 Circumvallate placenta, second trimester: Secondary | ICD-10-CM

## 2023-10-28 DIAGNOSIS — O09522 Supervision of elderly multigravida, second trimester: Secondary | ICD-10-CM

## 2023-10-28 DIAGNOSIS — O283 Abnormal ultrasonic finding on antenatal screening of mother: Secondary | ICD-10-CM

## 2023-10-28 DIAGNOSIS — O09299 Supervision of pregnancy with other poor reproductive or obstetric history, unspecified trimester: Secondary | ICD-10-CM

## 2023-10-28 MED ORDER — CEFADROXIL 500 MG PO CAPS: 500.0000 mg | ORAL_CAPSULE | Freq: Two times a day (BID) | ORAL | 0 refills | Status: DC

## 2023-10-28 NOTE — Progress Notes (Addendum)
   PRENATAL VISIT NOTE  Subjective:  Kristen [name] is a 38 y.o. G7P6006 at [redacted]w[redacted]d being seen today for ongoing prenatal care.  She is currently monitored for the following issues for this high-risk pregnancy and has Supervision of high risk pregnancy, antepartum; Short interval between pregnancies affecting pregnancy, antepartum; History of gestational diabetes in prior pregnancy, currently pregnant; Advanced maternal age in multigravida; History of polyhydramnios; Grand multiparity with antenatal problem, antepartum; History of umbilical hernia repair; Breast feeding status of mother; and Prediabetes in mother during pregnancy on their problem list.  Patient reports gets infection on legs (boils) frequently. 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 Firm nodule, red, painful.  Contractions: Not present. Vag. Bleeding: None.  Movement: Present. Denies leaking of fluid.   The following portions of the patient's history were reviewed and updated as appropriate: allergies, current medications, past family history, past medical history, past social history, past surgical history and problem list.   Objective:    Vitals:   10/28/23 0812  BP: 125/72  Pulse: 78  Weight: 151 lb (68.5 kg)    Fetal Status:  Fetal Heart Rate (bpm): 149   Movement: Present    General: Alert, oriented and cooperative. Patient is in no acute distress.  Skin: Skin is warm and dry. No rash noted.   Cardiovascular: Normal heart rate noted  Respiratory: Normal respiratory effort, no problems with respiration noted  Abdomen: Soft, gravid, appropriate for gestational age.  Pain/Pressure: Absent     Pelvic: Cervical exam deferred        Extremities: Normal range of motion.  Edema: None  Mental Status: Normal mood and affect. Normal behavior. Normal judgment and thought content.   Assessment and Plan:  Pregnancy: G7P6006 at [redacted]w[redacted]d 1. [redacted] weeks gestation of pregnancy (Primary)  2. Supervision of high risk  pregnancy, antepartum FHT normal  3. Short interval between pregnancies affecting pregnancy, antepartum  4. Multigravida of advanced maternal age in second trimester On ASA 81mg   5. History of gestational diabetes in prior pregnancy, currently pregnant  6. History of polyhydramnios AFI in third trimester  7. Grand multiparity with antenatal problem, antepartum  8. Boil Cefadroxil   9. Circumvallate placenta during pregnancy in second trimester, antepartum Will watch growth  10. persistent fetal right umbilical vein Patient referred for fetal echo   Preterm labor symptoms and general obstetric precautions including but not limited to vaginal bleeding, contractions, leaking of fluid and fetal movement were reviewed in detail with the patient. Please refer to After Visit Summary for other counseling recommendations.   No follow-ups on file.  Future Appointments  Date Time Provider Department Center  11/25/2023  8:35 AM Krisha Beegle J, DO [facility] None  12/01/2023  8:15 AM [facility] PROVIDER 1 [facility]  12/01/2023  8:30 AM [facility] US4 [facility]    Lang JINNY Peel, DO

## 2023-11-17 ENCOUNTER — Encounter: Admitting: Family Medicine

## 2023-11-25 ENCOUNTER — Encounter: Admitting: Family Medicine

## 2023-11-26 ENCOUNTER — Encounter: Payer: Self-pay | Admitting: Family Medicine

## 2023-12-01 ENCOUNTER — Ambulatory Visit

## 2023-12-01 ENCOUNTER — Ambulatory Visit: Attending: Maternal & Fetal Medicine

## 2023-12-08 ENCOUNTER — Encounter: Admitting: Family Medicine

## 2023-12-22 ENCOUNTER — Telehealth: Payer: Self-pay

## 2024-01-05 ENCOUNTER — Ambulatory Visit (INDEPENDENT_AMBULATORY_CARE_PROVIDER_SITE_OTHER): Admitting: Family Medicine

## 2024-01-05 VITALS — BP 119/66 | HR 92 | Wt 161.1 lb

## 2024-01-05 DIAGNOSIS — Z3A29 29 weeks gestation of pregnancy: Secondary | ICD-10-CM

## 2024-01-05 DIAGNOSIS — O09299 Supervision of pregnancy with other poor reproductive or obstetric history, unspecified trimester: Secondary | ICD-10-CM

## 2024-01-05 DIAGNOSIS — O283 Abnormal ultrasonic finding on antenatal screening of mother: Secondary | ICD-10-CM

## 2024-01-05 DIAGNOSIS — O09523 Supervision of elderly multigravida, third trimester: Secondary | ICD-10-CM | POA: Diagnosis not present

## 2024-01-05 DIAGNOSIS — O09899 Supervision of other high risk pregnancies, unspecified trimester: Secondary | ICD-10-CM

## 2024-01-05 DIAGNOSIS — Z9889 Other specified postprocedural states: Secondary | ICD-10-CM

## 2024-01-05 DIAGNOSIS — O0990 Supervision of high risk pregnancy, unspecified, unspecified trimester: Secondary | ICD-10-CM

## 2024-01-05 DIAGNOSIS — O99810 Abnormal glucose complicating pregnancy: Secondary | ICD-10-CM

## 2024-01-05 DIAGNOSIS — Z8632 Personal history of gestational diabetes: Secondary | ICD-10-CM

## 2024-01-05 DIAGNOSIS — O43112 Circumvallate placenta, second trimester: Secondary | ICD-10-CM

## 2024-01-05 DIAGNOSIS — Z8719 Personal history of other diseases of the digestive system: Secondary | ICD-10-CM

## 2024-01-05 NOTE — Progress Notes (Signed)
   PRENATAL VISIT NOTE  Subjective:  Kristen [name] is a 38 y.o. G7P6006 at [redacted]w[redacted]d being seen today for ongoing prenatal care.  She is currently monitored for the following issues for this high-risk pregnancy and has Supervision of high risk pregnancy, antepartum; Short interval between pregnancies affecting pregnancy, antepartum; History of gestational diabetes in prior pregnancy, currently pregnant; Advanced maternal age in multigravida; History of polyhydramnios; Grand multiparity with antenatal problem, antepartum; History of umbilical hernia repair; Breast feeding status of mother; Prediabetes in mother during pregnancy; Circumvallate placenta during pregnancy in second trimester, antepartum; and Abnormal fetal ultrasound on their problem list.  Patient reports occasional contractions.  Contractions: Irritability. Vag. Bleeding: None.  Movement: Present. Denies leaking of fluid.   The following portions of the patient's history were reviewed and updated as appropriate: allergies, current medications, past family history, past medical history, past social history, past surgical history and problem list.   Objective:    Vitals:   01/05/24 0819  BP: 119/66  Pulse: 92  Weight: 161 lb 1.9 oz (73.1 kg)    Fetal Status:  Fetal Heart Rate (bpm): 150   Movement: Present    General: Alert, oriented and cooperative. Patient is in no acute distress.  Skin: Skin is warm and dry. No rash noted.   Cardiovascular: Normal heart rate noted  Respiratory: Normal respiratory effort, no problems with respiration noted  Abdomen: Soft, gravid, appropriate for gestational age.  Pain/Pressure: Absent     Pelvic: Cervical exam deferred        Extremities: Normal range of motion.  Edema: None  Mental Status: Normal mood and affect. Normal behavior. Normal judgment and thought content.   Assessment and Plan:  Pregnancy: G7P6006 at [redacted]w[redacted]d 1. [redacted] weeks gestation of pregnancy (Primary) - Glucose Tolerance, 2 Hours  w/1 Hour - RPR - HIV Antibody (routine testing w rflx) - CBC  2. Supervision of high risk pregnancy, antepartum FHT normal BH contractions Wants BTL - has private insurance. Has h/o hernia repair. Will look at surgical notes for plan.  3. Short interval between pregnancies affecting pregnancy, antepartum  4. History of gestational diabetes in prior pregnancy, currently pregnant 2hr GTT today  5. Multigravida of advanced maternal age in third trimester ASA 81mg  recommended  6. History of umbilical hernia repair   7. Prediabetes in mother during pregnancy  8. Circumvallate placenta during pregnancy in second trimester, antepartum Missed last US  - will schedule follow up US   9. Abnormal fetal ultrasound PRUV - fetal echo normal Growth US  scheduled  Preterm labor symptoms and general obstetric precautions including but not limited to vaginal bleeding, contractions, leaking of fluid and fetal movement were reviewed in detail with the patient. Please refer to After Visit Summary for other counseling recommendations.   No follow-ups on file.  Future Appointments  Date Time Provider Department Center  01/20/2024  1:30 PM Lavere Shinsky J, DO [facility] None  02/03/2024  2:10 PM Gayatri Teasdale J, DO [facility] None  02/17/2024  2:10 PM Makila Colombe J, DO [facility] None  02/24/2024  1:30 PM Zettie Gootee J, DO [facility] None  03/02/2024  2:30 PM Jahmia Berrett J, DO [facility] None  03/08/2024  2:30 PM Dunn, Rollo DASEN, MD [facility] None    Lang JINNY Peel, DO

## 2024-01-06 ENCOUNTER — Encounter: Payer: Self-pay | Admitting: Family Medicine

## 2024-01-06 ENCOUNTER — Ambulatory Visit: Payer: Self-pay | Admitting: Family Medicine

## 2024-01-06 LAB — CBC
Hematocrit: 37.4 % (ref 34.0–46.6)
Hemoglobin: 12.3 g/dL (ref 11.1–15.9)
MCH: 30.5 pg (ref 26.6–33.0)
MCHC: 32.9 g/dL (ref 31.5–35.7)
MCV: 93 fL (ref 79–97)
Platelets: 259 x10E3/uL (ref 150–450)
RBC: 4.03 x10E6/uL (ref 3.77–5.28)
RDW: 13.4 % (ref 11.7–15.4)
WBC: 8.5 x10E3/uL (ref 3.4–10.8)

## 2024-01-06 LAB — GLUCOSE TOLERANCE, 2 HOURS W/ 1HR
Glucose, 1 hour: 206 mg/dL — ABNORMAL HIGH (ref 70–179)
Glucose, 2 hour: 119 mg/dL (ref 70–152)
Glucose, Fasting: 96 mg/dL — ABNORMAL HIGH (ref 70–91)

## 2024-01-06 LAB — HIV ANTIBODY (ROUTINE TESTING W REFLEX): HIV Screen 4th Generation wRfx: NONREACTIVE

## 2024-01-06 LAB — RPR: RPR Ser Ql: NONREACTIVE

## 2024-01-06 MED ORDER — BLOOD GLUCOSE MONITORING SUPPL DEVI: 1.0000 | Freq: Three times a day (TID) | 0 refills | Status: AC

## 2024-01-06 MED ORDER — LANCET DEVICE MISC
1.0000 | Freq: Three times a day (TID) | 0 refills | Status: AC
Start: 2024-01-06 — End: 2024-02-05

## 2024-01-06 MED ORDER — BLOOD GLUCOSE TEST VI STRP: 1.0000 | ORAL_STRIP | Freq: Four times a day (QID) | 3 refills | Status: AC

## 2024-01-06 MED ORDER — LANCETS MISC. MISC: 1.0000 | Freq: Four times a day (QID) | 0 refills | Status: AC

## 2024-01-17 ENCOUNTER — Telehealth: Payer: Self-pay

## 2024-01-17 NOTE — Telephone Encounter (Signed)
 Spoke with patient.  Pharmacy had put a hold on rx.  Called pharmacy, they will reactive order.  Kristen [name]

## 2024-01-20 ENCOUNTER — Ambulatory Visit (INDEPENDENT_AMBULATORY_CARE_PROVIDER_SITE_OTHER): Admitting: Family Medicine

## 2024-01-20 VITALS — BP 116/71 | HR 99 | Wt 163.0 lb

## 2024-01-20 DIAGNOSIS — O24410 Gestational diabetes mellitus in pregnancy, diet controlled: Secondary | ICD-10-CM

## 2024-01-20 DIAGNOSIS — O0990 Supervision of high risk pregnancy, unspecified, unspecified trimester: Secondary | ICD-10-CM | POA: Diagnosis not present

## 2024-01-20 DIAGNOSIS — Z3A31 31 weeks gestation of pregnancy: Secondary | ICD-10-CM

## 2024-01-20 DIAGNOSIS — O09899 Supervision of other high risk pregnancies, unspecified trimester: Secondary | ICD-10-CM

## 2024-01-20 DIAGNOSIS — O09523 Supervision of elderly multigravida, third trimester: Secondary | ICD-10-CM

## 2024-01-20 NOTE — Progress Notes (Signed)
   PRENATAL VISIT NOTE  Subjective:  Kristen [name] is a 38 y.o. G7P6006 at [redacted]w[redacted]d being seen today for ongoing prenatal care.  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  Patient reports no complaints.  Contractions: Not present. Vag. Bleeding: None.  Movement: Present. Denies leaking of fluid.   The following portions of the patient's history were reviewed and updated as appropriate: allergies, current medications, past family history, past medical history, past social history, past surgical history and problem list.   Objective:    Vitals:   01/20/24 1333  BP: 116/71  Pulse: 99  Weight: 163 lb (73.9 kg)    Fetal Status:  Fetal Heart Rate (bpm): 140   Movement: Present    General: Alert, oriented and cooperative. Patient is in no acute distress.  Skin: Skin is warm and dry. No rash noted.   Cardiovascular: Normal heart rate noted  Respiratory: Normal respiratory effort, no problems with respiration noted  Abdomen: Soft, gravid, appropriate for gestational age.  Pain/Pressure: Absent     Pelvic: Cervical exam deferred        Extremities: Normal range of motion.  Edema: None  Mental Status: Normal mood and affect. Normal behavior. Normal judgment and thought content.   Assessment and Plan:  Pregnancy: G7P6006 at [redacted]w[redacted]d 1. [redacted] weeks gestation of pregnancy  (Primary)  2. Supervision of high risk pregnancy, antepartum FHT normal  3. Diet controlled gestational diabetes mellitus (GDM) in third trimester Just picked up testing supplies.  First CBGs a little elevated Will continue to monitor. Has F/u US  at 36 weeks. Recommended eating a snack at night before bed.  4. Short interval between pregnancies affecting pregnancy, antepartum  5. Multigravida of advanced maternal age in third trimester On asa 81mg   Preterm labor symptoms and general obstetric precautions including but not limited to vaginal bleeding, contractions, leaking of fluid and fetal movement were reviewed in detail with the patient. Please refer to After Visit Summary for other counseling recommendations.   No follow-ups on file.  Future Appointments  Date Time Provider Department Center  02/03/2024  2:10 PM Therisa Mennella J, DO [facility] None  02/14/2024  1:15 PM [facility] PROVIDER 1 [facility]  02/14/2024  1:30 PM [facility] US4 [facility]  02/17/2024  2:10 PM Stormy Sabol J, DO [facility] None  02/24/2024  1:30 PM Solash Tullo J, DO [facility] None  03/02/2024  2:30 PM Gearline Spilman J, DO [facility] None  03/08/2024  2:30 PM Dunn, Rollo DASEN, MD [facility] None    Homer Pfeifer J Stanton Kissoon, DO

## 2024-02-03 ENCOUNTER — Ambulatory Visit (INDEPENDENT_AMBULATORY_CARE_PROVIDER_SITE_OTHER): Admitting: Family Medicine

## 2024-02-03 VITALS — BP 138/74 | HR 103 | Wt 166.0 lb

## 2024-02-03 DIAGNOSIS — O24410 Gestational diabetes mellitus in pregnancy, diet controlled: Secondary | ICD-10-CM | POA: Diagnosis not present

## 2024-02-03 DIAGNOSIS — O43112 Circumvallate placenta, second trimester: Secondary | ICD-10-CM

## 2024-02-03 DIAGNOSIS — Z3A33 33 weeks gestation of pregnancy: Secondary | ICD-10-CM | POA: Diagnosis not present

## 2024-02-03 DIAGNOSIS — O09899 Supervision of other high risk pregnancies, unspecified trimester: Secondary | ICD-10-CM | POA: Diagnosis not present

## 2024-02-03 DIAGNOSIS — O0990 Supervision of high risk pregnancy, unspecified, unspecified trimester: Secondary | ICD-10-CM

## 2024-02-03 DIAGNOSIS — O0940 Supervision of pregnancy with grand multiparity, unspecified trimester: Secondary | ICD-10-CM

## 2024-02-03 DIAGNOSIS — O09523 Supervision of elderly multigravida, third trimester: Secondary | ICD-10-CM

## 2024-02-03 MED ORDER — METFORMIN HCL 500 MG PO TABS: 500.0000 mg | ORAL_TABLET | Freq: Every day | ORAL | 5 refills | Status: AC

## 2024-02-03 NOTE — Progress Notes (Signed)
   PRENATAL VISIT NOTE  Subjective:  Kristen [name] is a 38 y.o. G7P6006 at [redacted]w[redacted]d being seen today for ongoing prenatal care.  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  Patient reports heartburn.  Contractions: Irritability. Vag. Bleeding: None.  Movement: Present. Denies leaking of fluid.   The following portions of the patient's history were reviewed and updated as appropriate: allergies, current medications, past family history, past medical history, past social history, past surgical history and problem list.   Objective:    Vitals:   02/03/24 1411  BP: 138/74  Pulse: (!) 103  Weight: 166 lb 0.6 oz (75.3 kg)    Fetal Status:  Fetal Heart Rate (bpm): 148   Movement: Present    General: Alert, oriented and cooperative. Patient is in no acute distress.  Skin: Skin is warm and dry. No rash noted.   Cardiovascular: Normal heart rate noted  Respiratory: Normal respiratory effort, no problems with respiration noted  Abdomen: Soft, gravid, appropriate for gestational age.  Pain/Pressure: Present     Pelvic: Cervical exam deferred        Extremities: Normal range of motion.  Edema: None  Mental Status: Normal mood and affect. Normal behavior. Normal judgment and thought content.   Assessment and Plan:  Pregnancy: G7P6006 at [redacted]w[redacted]d 1. [redacted] weeks gestation of  pregnancy (Primary) - US  Fetal BPP W/O Non Stress; Future  2. Supervision of high risk pregnancy, antepartum FHT normal - US  Fetal BPP W/O Non Stress; Future  3. Diet controlled gestational diabetes mellitus (GDM) in third trimester [place] elevated Eating bedtime snack Checking first thing Fasting CBGs 105-110. Start metformin at night BPPs weekly. Induction at 38-39 weeks - US  Fetal BPP W/O Non Stress; Future  4. Short interval between pregnancies affecting pregnancy, antepartum  5. Multigravida of advanced maternal age in third trimester  6. Grand multiparity with antenatal problem, antepartum  7. Circumvallate placenta during pregnancy in second trimester, antepartum    Preterm labor symptoms and general obstetric precautions including but not limited to vaginal bleeding, contractions, leaking of fluid and fetal movement were reviewed in detail with the patient. Please refer to After Visit Summary for other counseling recommendations.   No follow-ups on file.  Future Appointments  Date Time Provider Department Center  02/14/2024  1:15 PM [facility] PROVIDER 1 [facility]  02/14/2024  1:30 PM [facility] US4 [facility]  02/17/2024  2:10 PM Ahniyah Giancola J, DO [facility] None  02/24/2024  1:30 PM Albertha Beattie J, DO [facility] None  03/02/2024  2:30 PM Orra Nolde J, DO [facility] None  03/08/2024  2:30 PM Dunn, Rollo DASEN, MD [facility] None    Khalaya Mcgurn J Orva Riles, DO

## 2024-02-10 ENCOUNTER — Other Ambulatory Visit

## 2024-02-10 ENCOUNTER — Other Ambulatory Visit: Payer: Self-pay

## 2024-02-10 DIAGNOSIS — Z3A33 33 weeks gestation of pregnancy: Secondary | ICD-10-CM

## 2024-02-10 DIAGNOSIS — O0993 Supervision of high risk pregnancy, unspecified, third trimester: Secondary | ICD-10-CM | POA: Diagnosis not present

## 2024-02-10 DIAGNOSIS — O0990 Supervision of high risk pregnancy, unspecified, unspecified trimester: Secondary | ICD-10-CM

## 2024-02-10 DIAGNOSIS — O24410 Gestational diabetes mellitus in pregnancy, diet controlled: Secondary | ICD-10-CM

## 2024-02-10 DIAGNOSIS — Z3A34 34 weeks gestation of pregnancy: Secondary | ICD-10-CM | POA: Diagnosis not present

## 2024-02-14 ENCOUNTER — Ambulatory Visit

## 2024-02-14 ENCOUNTER — Ambulatory Visit: Attending: Obstetrics | Admitting: Obstetrics

## 2024-02-14 VITALS — BP 128/77 | HR 94

## 2024-02-14 DIAGNOSIS — O09523 Supervision of elderly multigravida, third trimester: Secondary | ICD-10-CM | POA: Insufficient documentation

## 2024-02-14 DIAGNOSIS — Z3A35 35 weeks gestation of pregnancy: Secondary | ICD-10-CM

## 2024-02-14 DIAGNOSIS — O09893 Supervision of other high risk pregnancies, third trimester: Secondary | ICD-10-CM | POA: Diagnosis not present

## 2024-02-14 DIAGNOSIS — O09899 Supervision of other high risk pregnancies, unspecified trimester: Secondary | ICD-10-CM

## 2024-02-14 DIAGNOSIS — O09522 Supervision of elderly multigravida, second trimester: Secondary | ICD-10-CM

## 2024-02-14 DIAGNOSIS — O3663X0 Maternal care for excessive fetal growth, third trimester, not applicable or unspecified: Secondary | ICD-10-CM

## 2024-02-14 DIAGNOSIS — O09293 Supervision of pregnancy with other poor reproductive or obstetric history, third trimester: Secondary | ICD-10-CM | POA: Insufficient documentation

## 2024-02-14 DIAGNOSIS — O24410 Gestational diabetes mellitus in pregnancy, diet controlled: Secondary | ICD-10-CM | POA: Diagnosis not present

## 2024-02-14 DIAGNOSIS — O43193 Other malformation of placenta, third trimester: Secondary | ICD-10-CM | POA: Insufficient documentation

## 2024-02-14 DIAGNOSIS — O24415 Gestational diabetes mellitus in pregnancy, controlled by oral hypoglycemic drugs: Secondary | ICD-10-CM | POA: Insufficient documentation

## 2024-02-14 DIAGNOSIS — Z7984 Long term (current) use of oral hypoglycemic drugs: Secondary | ICD-10-CM | POA: Diagnosis not present

## 2024-02-14 DIAGNOSIS — O2693 Pregnancy related conditions, unspecified, third trimester: Secondary | ICD-10-CM | POA: Insufficient documentation

## 2024-02-14 DIAGNOSIS — O09299 Supervision of pregnancy with other poor reproductive or obstetric history, unspecified trimester: Secondary | ICD-10-CM

## 2024-02-14 DIAGNOSIS — O0943 Supervision of pregnancy with grand multiparity, third trimester: Secondary | ICD-10-CM | POA: Diagnosis not present

## 2024-02-14 DIAGNOSIS — O0940 Supervision of pregnancy with grand multiparity, unspecified trimester: Secondary | ICD-10-CM | POA: Diagnosis present

## 2024-02-14 NOTE — Progress Notes (Signed)
 MFM Consult Note  Kristen [name] is currently at 35 weeks and 2 days.  She was seen due to gestational diabetes that is treated with metformin.  She reports that her fingerstick values have mostly been within normal limits.    A persistent right umbilical vein was noted on her prior ultrasound exam.  Due to this indication, she had a normal fetal echocardiogram performed with [facility] pediatric cardiology.  Sonographic findings Single intrauterine pregnancy at 35w 2d.  Fetal cardiac activity:  Observed and appears normal. Presentation: Cephalic. Fetal biometry shows the estimated fetal weight of 7 pounds 11 ounces which measures at the > 99th percentile. Amniotic fluid volume: Within normal limits.  AFI: 13.73 cm.  MVP: 5.34 cm. Placenta: Circumvallate placenta-posterior. Fetal movements and fetal breathing movements were noted throughout today's exam.  A persistent right umbilical vein continues to be noted on today's exam.  There are limitations of prenatal ultrasound such as the inability to detect certain abnormalities due to poor visualization. Various factors such as fetal position, gestational age and maternal body habitus may increase the difficulty in visualizing the fetal anatomy.    Gestational diabetes  Due to gestational diabetes and a large for gestational age fetus noted today, delivery may be considered at between 12 to 38 weeks.  She should continue weekly fetal testing until delivery.    No further exams were scheduled in our office.    The patient stated that all of her questions were answered today.  A total of 20 minutes was spent counseling and coordinating the care for this patient.  Greater than 50% of the time was spent in direct face-to-face contact.

## 2024-02-17 ENCOUNTER — Other Ambulatory Visit: Payer: Self-pay

## 2024-02-17 ENCOUNTER — Ambulatory Visit (INDEPENDENT_AMBULATORY_CARE_PROVIDER_SITE_OTHER): Admitting: Family Medicine

## 2024-02-17 ENCOUNTER — Other Ambulatory Visit (HOSPITAL_COMMUNITY)
Admission: RE | Admit: 2024-02-17 | Discharge: 2024-02-17 | Disposition: A | Discharge status: Home / Self Care | Source: Ambulatory Visit | Attending: Family Medicine | Admitting: Family Medicine

## 2024-02-17 ENCOUNTER — Other Ambulatory Visit

## 2024-02-17 VITALS — BP 127/77 | HR 96 | Wt 165.0 lb

## 2024-02-17 DIAGNOSIS — Z3A35 35 weeks gestation of pregnancy: Secondary | ICD-10-CM

## 2024-02-17 DIAGNOSIS — O0990 Supervision of high risk pregnancy, unspecified, unspecified trimester: Secondary | ICD-10-CM

## 2024-02-17 DIAGNOSIS — O09523 Supervision of elderly multigravida, third trimester: Secondary | ICD-10-CM

## 2024-02-17 DIAGNOSIS — O09299 Supervision of pregnancy with other poor reproductive or obstetric history, unspecified trimester: Secondary | ICD-10-CM

## 2024-02-17 DIAGNOSIS — O0993 Supervision of high risk pregnancy, unspecified, third trimester: Secondary | ICD-10-CM

## 2024-02-17 DIAGNOSIS — O24410 Gestational diabetes mellitus in pregnancy, diet controlled: Secondary | ICD-10-CM

## 2024-02-17 DIAGNOSIS — Z8719 Personal history of other diseases of the digestive system: Secondary | ICD-10-CM

## 2024-02-17 DIAGNOSIS — O09899 Supervision of other high risk pregnancies, unspecified trimester: Secondary | ICD-10-CM | POA: Diagnosis not present

## 2024-02-17 DIAGNOSIS — Z9889 Other specified postprocedural states: Secondary | ICD-10-CM

## 2024-02-17 DIAGNOSIS — Z8632 Personal history of gestational diabetes: Secondary | ICD-10-CM

## 2024-02-17 NOTE — Progress Notes (Signed)
 PRENATAL VISIT NOTE  Subjective:  Kristen [name] is a 38 y.o. G7P6006 at [redacted]w[redacted]d being seen today for ongoing prenatal care.  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  Patient reports no complaints. Having some pressure. Baby feels big. She is going to be moving to [place] around the 13th. Her husband has been working there and their lease is up.  Contractions: Irritability. Vag. Bleeding: None.  Movement: Present. Denies leaking of fluid.   The following portions of the patient's history were reviewed and updated as appropriate: allergies, current medications, past family history, past medical history, past social history, past surgical history and problem list.   Objective:   Vitals:   02/17/24 1435  BP: 127/77  Pulse: 96  Weight: 165 lb (74.8 kg)    Fetal Status:  Fetal Heart Rate (bpm): 161   Movement: Present    General: Alert, oriented and cooperative. Patient is in no acute distress.  Skin: Skin is warm and dry. No rash noted.   Cardiovascular: Normal heart rate noted  Respiratory: Normal respiratory effort, no problems with respiration noted  Abdomen: Soft, gravid, appropriate for gestational age.  Pain/Pressure: Present     Pelvic: Cervical exam deferred        Extremities: Normal range of motion.  Edema: None  Mental Status: Normal mood and  affect. Normal behavior. Normal judgment and thought content.      08/26/2023    2:43 PM 06/04/2022    8:21 AM  Depression screen PHQ 2/9  Decreased Interest 0 0  Down, Depressed, Hopeless 0 0  PHQ - 2 Score 0 0  Altered sleeping 2 1  Tired, decreased energy 0 0  Change in appetite 0 0  Feeling bad or failure about yourself  0 0  Trouble concentrating 0 0  Moving slowly or fidgety/restless 0 0  Suicidal thoughts 0 0  PHQ-9 Score 2  1   Difficult doing work/chores Not difficult at all      Data saved with a previous flowsheet row definition        08/26/2023    2:43 PM 06/04/2022    8:21 AM  GAD 7 : Generalized Anxiety Score  Nervous, Anxious, on Edge 0 0  Control/stop worrying 0 0  Worry too much - different things 0 0  Trouble relaxing 0 0  Restless 0 0  Easily annoyed or irritable 0 0  Afraid - awful might happen 0 0  Total GAD 7 Score 0 0  Anxiety Difficulty Not difficult at all     Assessment and Plan:  Pregnancy: G7P6006 at [redacted]w[redacted]d 1. [redacted] weeks gestation of pregnancy (Primary)  2. Supervision of high risk pregnancy, antepartum FHT normal GBS today  3. Medication controlled gestational diabetes mellitus (GDM) in third trimester Due to macrosomia (EFW >99th%), MFM recommends delivery between 37-38 weeks. We can get her scheduled for induction around then, however this is past the day that she will have moved. Discussed that I cannot induce her before then due to prematurity.  One option would be to try to get OB provider there. She'll make some phone calls to establish care. We can also have her scheduled for induction here just in case. Will try to make the induction a priority.  4. Short interval between pregnancies affecting pregnancy, antepartum  5. Multigravida of advanced maternal age in third trimester ASA 81mg   6. History of gestational diabetes in prior pregnancy, currently pregnant  7. History of umbilical hernia repair   Preterm labor symptoms and  general obstetric precautions including but not limited to vaginal bleeding, contractions, leaking of fluid and fetal movement were reviewed in detail with the patient. Please refer to After Visit Summary for other counseling recommendations.   No follow-ups on file.  Future Appointments  Date Time Provider Department Center  02/24/2024 10:55 AM [facility] US2 [facility]  02/24/2024  1:30 PM Swan Fairfax J, DO [facility] None  03/02/2024  1:15 PM [facility] US2 [facility]  03/02/2024  2:30 PM Joany Khatib J, DO [facility] None  03/08/2024 10:15 AM [facility] US2 [facility]  03/08/2024  2:30 PM Dunn, Rollo DASEN, MD [facility] None    Lang JINNY Peel, DO

## 2024-02-19 LAB — STREP GP B NAA: Strep Gp B NAA: NEGATIVE

## 2024-02-21 ENCOUNTER — Encounter (HOSPITAL_COMMUNITY): Payer: Self-pay | Admitting: *Deleted

## 2024-02-21 ENCOUNTER — Telehealth (HOSPITAL_COMMUNITY): Payer: Self-pay | Admitting: *Deleted

## 2024-02-21 ENCOUNTER — Ambulatory Visit: Payer: Self-pay | Admitting: Family Medicine

## 2024-02-21 DIAGNOSIS — O0990 Supervision of high risk pregnancy, unspecified, unspecified trimester: Secondary | ICD-10-CM

## 2024-02-21 LAB — GC/CHLAMYDIA PROBE AMP (~~LOC~~) NOT AT ARMC
Chlamydia: NEGATIVE
Comment: NEGATIVE
Comment: NORMAL
Neisseria Gonorrhea: NEGATIVE

## 2024-02-21 NOTE — Telephone Encounter (Signed)
 Preadmission screen

## 2024-02-23 ENCOUNTER — Encounter: Admitting: Family Medicine

## 2024-02-23 ENCOUNTER — Other Ambulatory Visit: Payer: Self-pay | Admitting: *Deleted

## 2024-02-23 DIAGNOSIS — O24415 Gestational diabetes mellitus in pregnancy, controlled by oral hypoglycemic drugs: Secondary | ICD-10-CM

## 2024-02-24 ENCOUNTER — Other Ambulatory Visit

## 2024-02-24 ENCOUNTER — Encounter: Admitting: Family Medicine

## 2024-02-27 NOTE — H&P (Deleted)
 OBSTETRIC ADMISSION HISTORY AND PHYSICAL  Kristen [name] is a 38 y.o. female [phone] with IUP at [redacted]w[redacted]d by U/S presenting for IOL d/t medication controlled gDM and macrosomia (EFW 99% on 11/03). She reports +FMs, No LOF, no VB, no blurry vision, headaches or peripheral edema, and RUQ pain.  She plans on breast feeding. She request  for birth control. She received her prenatal care at [facility]   Dating: By U/S --->  Estimated Date of Delivery: 03/18/24  Sono:    @[redacted]w[redacted]d , -[redacted]w[redacted]d relative to LMP, normal anatomy, LGA with persistent right umbilical vein, cephalic presentation, circumvallate placenta-posterior, 3476g, 99% EFW (largest SVD 3941g 06/2016)   Prenatal History/Complications:  - GDM - macrosomia - grand multiparity - advanced maternal age - short interval between pregnancies - h/o polyhydramnios (not seen on most recent U/S) - circumvallate placenta - hx of horseshoe kidney - hx of umbilical hernia repair  Nursing Staff Provider  Office Location  [place] Dating  LMP  [facility] Model [x]  Traditional [ ]  Centering [ ]  Mom-Baby Dyad      Language  English Anatomy US   Normal   Flu Vaccine  Declined Genetic/Carrier Screen  NIPS:   LR female AFP:   Negative Horizon: neg  TDaP Vaccine  Declines Hgb A1C or  GTT Early  Third trimester - positive  COVID Vaccine     LAB RESULTS   Rhogam   NA Blood Type A/Positive/-- (08/24 1031)   Baby Feeding Plan  Breast Antibody Negative (08/24 1031)  Contraception  Undecided Rubella 2.63 (08/24 1031)  Circumcision  No RPR Non Reactive (08/24 1031)   Pediatrician   List given  HBsAg Negative (08/24 1031)   Support Person  Husband HCVAb Negative  Prenatal Classes  NA HIV Non Reactive (08/24 1031)     BTL Consent   GBS  neg  VBAC Consent  NA Pap Normal  12/04/21           DME Rx [ ]  BP cuff [ ]  Weight Scale Waterbirth  [ ]  Class [ ]  Consent [ ]  CNM visit  PHQ9 & GAD7 [  ] new OB [  ] 28 weeks  [  ] 36 weeks Induction  [ ]  Orders Entered [ ] Foley Y/N    Past Medical History: Past Medical History:  Diagnosis Date   Gestational diabetes    History of polyhydramnios 08/26/2023   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  MFM anatomical survey wnl No current concerns     Umbilical hernia without obstruction and without gangrene 05/17/2017   Formatting of this note might be different from the original.  Added automatically from request for surgery [id number]      Past Surgical History: Past Surgical History:  Procedure Laterality Date   HERNIA REPAIR  05/19/2017   with mesh    Obstetrical History: OB History     Gravida  7   Para  6   Term  6   Preterm      AB      Living  6      SAB      IAB      Ectopic      Multiple  0   Live Births  6           Social History Social History   Socioeconomic History   Marital status: Single    Spouse name: Not on file   Number of children:  Not on file   Years of education: Not on file   Highest education level: Not on file  Occupational History   Not on file  Tobacco Use   Smoking status: Former    Current packs/day: 0.50    Average packs/day: 0.5 packs/day for 5.5 years (2.8 ttl pk-yrs)    Types: Cigarettes    Start date: 08/13/2018   Smokeless tobacco: Not on file  Vaping Use   Vaping status: Never Used  Substance and Sexual Activity   Alcohol use: No   Drug use: No   Sexual activity: Yes    Birth control/protection: None  Other Topics Concern   Not on file  Social History Narrative   Not on file   Social Drivers of Health   Financial Resource Strain: Not on file  Food Insecurity: No Food Insecurity (06/24/2022)   Hunger Vital Sign    Worried About Running Out of Food in the Last Year: Never true    Ran Out of Food in the Last Year: Never true  Transportation Needs: No Transportation Needs (06/24/2022)   PRAPARE - Administrator, Civil Service (Medical): No    Lack of Transportation  (Non-Medical): No  Physical Activity: Not on file  Stress: Not on file  Social Connections: Not on file    Family History: Family History  Problem Relation Age of Onset   Diabetes Maternal Grandmother    Diabetes Maternal Grandfather    Diabetes Paternal Grandmother    Diabetes Paternal Grandfather     Allergies: Allergies  Allergen Reactions   Bactrim  [Sulfamethoxazole -Trimethoprim ] Nausea Only    No medications prior to admission.     Review of Systems   All systems reviewed and negative except as stated in HPI  Last menstrual period 05/25/2023, unknown if currently breastfeeding. General appearance:  Lungs: clear to auscultation bilaterally Heart: regular rate and rhythm Abdomen: soft, non-tender; bowel sounds normal Pelvic:  Extremities: Homans sign is negative, no sign of DVT DTR's  Presentation:  Fetal monitoring Uterine activity     Prenatal labs: ABO, Rh: A/Positive/-- (05/15 1426) Antibody: Negative (05/15 1426) Rubella: 2.12 (05/15 1426) RPR: Non Reactive (09/24 0903)  HBsAg: Negative (05/15 1426)  HIV: Non Reactive (09/24 0903)  GBS: Negative/-- (11/06 1516)    Lab Results  Component Value Date   GBS Negative 02/17/2024   GTT elevated fasting and 1 hour Genetic screening  LR female Anatomy US  macrosomia, persistent R umbilical vein  There is no immunization history for the selected administration types on file for this patient.  Prenatal Transfer Tool  Maternal Diabetes: Yes:  Diabetes Type:  Insulin/Medication controlled Genetic Screening: Normal Maternal Ultrasounds/Referrals: Fetal Heart Anomalies and Other: persistent right umbilical vein  Fetal Ultrasounds or other Referrals:  Fetal echo Maternal Substance Abuse:  No Significant Maternal Medications:  None Significant Maternal Lab Results: Group B Strep negative Number of Prenatal Visits:greater than 3 verified prenatal visits Maternal Vaccinations: N/A Other Comments:   None   No results found for this or any previous visit (from the past 24 hours).  Patient Active Problem List   Diagnosis Date Noted   Circumvallate placenta during pregnancy in second trimester, antepartum 10/28/2023   Abnormal fetal ultrasound 10/28/2023   Prediabetes in mother during pregnancy 08/27/2023   History of polyhydramnios 08/26/2023   Grand multiparity with antenatal problem, antepartum 08/26/2023   History of umbilical hernia repair 08/26/2023   Breast feeding status of mother 08/26/2023   Short interval between pregnancies  affecting pregnancy, antepartum 08/13/2023   History of gestational diabetes in prior pregnancy, currently pregnant 08/13/2023   Advanced maternal age in multigravida 08/13/2023   Gestational diabetes mellitus (GDM) in third trimester 01/16/2022   Supervision of high risk pregnancy, antepartum 12/04/2021    Assessment/Plan:  Kristen [name] is a 38 y.o. female [phone] with IUP at [redacted]w[redacted]d by U/S presenting for IOL d/t macrosomia (EFW 99% on 11/03).  #Labor: IOL for macrosomia 2/2 gDM, consider misopristol for cervical ripening, oxytocin  for uterine contractions #Pain: As per patient preference #FWB:  #GBS status:  negative #Feeding: Breastmilk  #Reproductive Life planning:  #Circ:  not applicable  #Macrosomia: shoulder dystocia protocol, PPH  Nunzio DELENA Cena Verma, Medical Student  02/27/2024, 8:36 PM

## 2024-02-28 ENCOUNTER — Inpatient Hospital Stay (HOSPITAL_COMMUNITY)

## 2024-02-28 ENCOUNTER — Inpatient Hospital Stay (HOSPITAL_COMMUNITY): Admission: AD | Admit: 2024-02-28 | Source: Home / Self Care

## 2024-02-28 NOTE — Progress Notes (Signed)
 Attempted to reach patient again @ 0915 on 02/28/2024 and LVM.  This nurse read through prenatal record and found the patient was moving to [place] on 11/13 as her lease was up and her husband had already moved for work and established residence in [place]. Notified Dr Barbra of finding.

## 2024-02-28 NOTE — H&P (Shared)
 OBSTETRIC ADMISSION HISTORY AND PHYSICAL  Kristen [name] is a 38 y.o. female [phone] with IUP at [redacted]w[redacted]d by U/S presenting for IOL d/t medication controlled gDM and macrosomia (EFW 99% on 11/03). She reports +FMs, No LOF, no VB, no blurry vision, headaches or peripheral edema, and RUQ pain.  She plans on breast feeding. She request ***  for birth control. She received her prenatal care at [facility]   Dating: By U/S --->  Estimated Date of Delivery: 03/18/24  Sono:    @[redacted]w[redacted]d , -[redacted]w[redacted]d relative to LMP, normal anatomy, LGA with persistent right umbilical vein, cephalic presentation, circumvallate placenta-posterior, 3476g, 99% EFW (largest SVD 3941g 06/2016)   Prenatal History/Complications:  - GDM - macrosomia - grand multiparity - advanced maternal age - short interval between pregnancies - h/o polyhydramnios (resolved on most recent U/S) - circumvallate placenta - hx of horseshoe kidney - hx of umbilical hernia repair  Nursing Staff Provider  Office Location  [place] Dating  LMP  [facility] Model [x]  Traditional [ ]  Centering [ ]  Mom-Baby Dyad      Language  English Anatomy US   Normal   Flu Vaccine  Declined Genetic/Carrier Screen  NIPS:   LR female AFP:   Negative Horizon: neg  TDaP Vaccine  Declines Hgb A1C or  GTT Early  Third trimester - positive  COVID Vaccine     LAB RESULTS   Rhogam   NA Blood Type A/Positive/-- (08/24 1031)   Baby Feeding Plan  Breast Antibody Negative (08/24 1031)  Contraception  Undecided Rubella 2.63 (08/24 1031)  Circumcision  No RPR Non Reactive (08/24 1031)   Pediatrician   List given  HBsAg Negative (08/24 1031)   Support Person  Husband HCVAb Negative  Prenatal Classes  NA HIV Non Reactive (08/24 1031)     BTL Consent   GBS  neg  VBAC Consent  NA Pap Normal  12/04/21           DME Rx [ ]  BP cuff [ ]  Weight Scale Waterbirth  [ ]  Class [ ]  Consent [ ]  CNM visit  PHQ9 & GAD7 [  ] new OB [  ] 28 weeks  [  ] 36 weeks Induction  [ ]  Orders Entered [ ] Foley Y/N    Past Medical History: Past Medical History:  Diagnosis Date   Gestational diabetes    History of polyhydramnios 08/26/2023   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  MFM anatomical survey wnl No current concerns     Umbilical hernia without obstruction and without gangrene 05/17/2017   Formatting of this note might be different from the original.  Added automatically from request for surgery [id number]      Past Surgical History: Past Surgical History:  Procedure Laterality Date   HERNIA REPAIR  05/19/2017   with mesh    Obstetrical History: OB History     Gravida  7   Para  6   Term  6   Preterm      AB      Living  6      SAB      IAB      Ectopic      Multiple  0   Live Births  6           Social History Social History   Socioeconomic History   Marital status: Single    Spouse name: Not on file   Number of children:  Not on file   Years of education: Not on file   Highest education level: Not on file  Occupational History   Not on file  Tobacco Use   Smoking status: Former    Current packs/day: 0.50    Average packs/day: 0.5 packs/day for 5.5 years (2.8 ttl pk-yrs)    Types: Cigarettes    Start date: 08/13/2018   Smokeless tobacco: Not on file  Vaping Use   Vaping status: Never Used  Substance and Sexual Activity   Alcohol use: No   Drug use: No   Sexual activity: Yes    Birth control/protection: None  Other Topics Concern   Not on file  Social History Narrative   Not on file   Social Drivers of Health   Financial Resource Strain: Not on file  Food Insecurity: No Food Insecurity (06/24/2022)   Hunger Vital Sign    Worried About Running Out of Food in the Last Year: Never true    Ran Out of Food in the Last Year: Never true  Transportation Needs: No Transportation Needs (06/24/2022)   PRAPARE - Administrator, Civil Service (Medical): No    Lack of Transportation  (Non-Medical): No  Physical Activity: Not on file  Stress: Not on file  Social Connections: Not on file    Family History: Family History  Problem Relation Age of Onset   Diabetes Maternal Grandmother    Diabetes Maternal Grandfather    Diabetes Paternal Grandmother    Diabetes Paternal Grandfather     Allergies: Allergies  Allergen Reactions   Bactrim  [Sulfamethoxazole -Trimethoprim ] Nausea Only    No medications prior to admission.     Review of Systems   All systems reviewed and negative except as stated in HPI  Last menstrual period 05/25/2023, unknown if currently breastfeeding. General appearance:  Lungs: clear to auscultation bilaterally Heart: regular rate and rhythm Abdomen: soft, non-tender; bowel sounds normal Pelvic:  Extremities: Homans sign is negative, no sign of DVT DTR's  Presentation:  Fetal monitoring Uterine activity     Prenatal labs: ABO, Rh: A/Positive/-- (05/15 1426) Antibody: Negative (05/15 1426) Rubella: 2.12 (05/15 1426) RPR: Non Reactive (09/24 0903)  HBsAg: Negative (05/15 1426)  HIV: Non Reactive (09/24 0903)  GBS: Negative/-- (11/06 1516)    Lab Results  Component Value Date   GBS Negative 02/17/2024   GTT elevated fasting and 1 hour Genetic screening  LR female Anatomy US  macrosomia, persistent R umbilical vein  There is no immunization history for the selected administration types on file for this patient.  Prenatal Transfer Tool  Maternal Diabetes: Yes:  Diabetes Type:  Insulin/Medication controlled Genetic Screening: Normal Maternal Ultrasounds/Referrals: Fetal Heart Anomalies and Other: persistent right umbilical vein  Fetal Ultrasounds or other Referrals:  Fetal echo Maternal Substance Abuse:  No Significant Maternal Medications:  None Significant Maternal Lab Results: Group B Strep negative Number of Prenatal Visits:greater than 3 verified prenatal visits Maternal Vaccinations: N/A Other Comments:   None   No results found for this or any previous visit (from the past 24 hours).  Patient Active Problem List   Diagnosis Date Noted   Circumvallate placenta during pregnancy in second trimester, antepartum 10/28/2023   Abnormal fetal ultrasound 10/28/2023   Prediabetes in mother during pregnancy 08/27/2023   History of polyhydramnios 08/26/2023   Grand multiparity with antenatal problem, antepartum 08/26/2023   History of umbilical hernia repair 08/26/2023   Breast feeding status of mother 08/26/2023   Short interval between pregnancies  affecting pregnancy, antepartum 08/13/2023   History of gestational diabetes in prior pregnancy, currently pregnant 08/13/2023   Advanced maternal age in multigravida 08/13/2023   Gestational diabetes mellitus (GDM) in third trimester 01/16/2022   Supervision of high risk pregnancy, antepartum 12/04/2021    Assessment/Plan:  Kristen [name] is a 38 y.o. female [phone] with IUP at [redacted]w[redacted]d by U/S presenting for IOL d/t macrosomia (EFW 99% on 11/03).  #Labor: IOL for macrosomia 2/2 gDM, consider misopristol for cervical ripening, oxytocin  for uterine contractions #Pain: As per patient preference #FWB:  #GBS status:  negative #Feeding: Breastmilk  #Reproductive Life planning:  #Circ:  not applicable  #Macrosomia: shoulder dystocia protocol, PPH  Nunzio DELENA Cena Verma, Medical Student  02/27/2024, 8:36 PM

## 2024-02-28 NOTE — Progress Notes (Signed)
 Unit secretary called on 11/17 at 0015 to get information on patient where about's, no answer LVM.

## 2024-02-28 NOTE — Progress Notes (Signed)
 Unit secretary called on 11/17 at 0030 to get information on patient where about's, no answer LVM.

## 2024-02-28 NOTE — Progress Notes (Signed)
 Unit secretary called alternate contact of significant other on 11/17 at 0030  to get information on patient where about's, no answer LVM.

## 2024-03-02 ENCOUNTER — Other Ambulatory Visit

## 2024-03-02 ENCOUNTER — Encounter: Admitting: Family Medicine

## 2024-03-08 ENCOUNTER — Encounter: Admitting: Obstetrics and Gynecology

## 2024-03-08 ENCOUNTER — Other Ambulatory Visit
